# Patient Record
Sex: Female | Born: 1965 | Race: White | Hispanic: No | Marital: Married | Smoking: Current every day smoker
Health system: Southern US, Community
[De-identification: ages and names within clinical notes are randomized; demographics above are authoritative.]

## PROBLEM LIST (undated history)

## (undated) DIAGNOSIS — F419 Anxiety disorder, unspecified: Secondary | ICD-10-CM

## (undated) DIAGNOSIS — N200 Calculus of kidney: Secondary | ICD-10-CM

## (undated) DIAGNOSIS — K56609 Unspecified intestinal obstruction, unspecified as to partial versus complete obstruction: Secondary | ICD-10-CM

## (undated) DIAGNOSIS — K5090 Crohn's disease, unspecified, without complications: Secondary | ICD-10-CM

## (undated) DIAGNOSIS — Z765 Malingerer [conscious simulation]: Secondary | ICD-10-CM

## (undated) HISTORY — PX: CHOLECYSTECTOMY: SHX55

## (undated) HISTORY — PX: ABDOMINAL HYSTERECTOMY: SHX81

## (undated) HISTORY — PX: COLOSTOMY: SHX63

## (undated) HISTORY — PX: APPENDECTOMY: SHX54

## (undated) HISTORY — PX: HERNIA REPAIR: SHX51

---

## 2000-02-17 ENCOUNTER — Emergency Department (HOSPITAL_COMMUNITY): Admission: EM | Admit: 2000-02-17 | Discharge: 2000-02-17 | Payer: Self-pay | Admitting: Emergency Medicine

## 2010-07-11 ENCOUNTER — Emergency Department (HOSPITAL_BASED_OUTPATIENT_CLINIC_OR_DEPARTMENT_OTHER)
Admission: EM | Admit: 2010-07-11 | Discharge: 2010-07-12 | Payer: Self-pay | Source: Home / Self Care | Admitting: Emergency Medicine

## 2010-07-12 ENCOUNTER — Inpatient Hospital Stay (HOSPITAL_COMMUNITY): Admission: EM | Admit: 2010-07-12 | Discharge: 2010-07-14 | Payer: Self-pay | Source: Home / Self Care

## 2010-07-14 LAB — CBC
MCH: 29.7 pg (ref 26.0–34.0)
MCHC: 32.7 g/dL (ref 30.0–36.0)
Platelets: 221 10*3/uL (ref 150–400)
RDW: 13 % (ref 11.5–15.5)

## 2010-07-14 LAB — URINALYSIS, ROUTINE W REFLEX MICROSCOPIC
Bilirubin Urine: NEGATIVE
Hgb urine dipstick: NEGATIVE
Nitrite: NEGATIVE
Protein, ur: NEGATIVE mg/dL
Specific Gravity, Urine: 1.014 (ref 1.005–1.030)
Urobilinogen, UA: 0.2 mg/dL (ref 0.0–1.0)

## 2010-07-14 LAB — COMPREHENSIVE METABOLIC PANEL
ALT: 16 U/L (ref 0–35)
AST: 27 U/L (ref 0–37)
CO2: 23 mEq/L (ref 19–32)
Calcium: 9.6 mg/dL (ref 8.4–10.5)
Creatinine, Ser: 0.8 mg/dL (ref 0.4–1.2)
GFR calc Af Amer: >60 mL/min (ref >60)
GFR calc non Af Amer: >60 mL/min (ref >60)
Sodium: 145 mEq/L (ref 135–145)
Total Protein: 7.8 g/dL (ref 6.0–8.3)

## 2010-07-14 LAB — DIFFERENTIAL
Basophils Absolute: 0 10*3/uL (ref 0.0–0.1)
Basophils Relative: 0 % (ref 0–1)
Eosinophils Absolute: 0.1 10*3/uL (ref 0.0–0.7)
Eosinophils Relative: 1 % (ref 0–5)
Monocytes Absolute: 0.5 10*3/uL (ref 0.1–1.0)
Monocytes Relative: 5 % (ref 3–12)
Neutro Abs: 6.7 10*3/uL (ref 1.7–7.7)

## 2010-07-15 ENCOUNTER — Inpatient Hospital Stay (HOSPITAL_COMMUNITY): Admission: EM | Admit: 2010-07-15 | Discharge: 2010-07-16 | Payer: Self-pay | Source: Home / Self Care

## 2010-07-15 ENCOUNTER — Emergency Department (HOSPITAL_BASED_OUTPATIENT_CLINIC_OR_DEPARTMENT_OTHER)
Admission: EM | Admit: 2010-07-15 | Discharge: 2010-07-15 | Disposition: A | Discharge status: Other Acute Inpatient Hospital | Payer: Self-pay | Source: Home / Self Care | Admitting: Emergency Medicine

## 2010-07-15 LAB — DIFFERENTIAL
Basophils Absolute: 0 10*3/uL (ref 0.0–0.1)
Lymphocytes Relative: 31 % (ref 12–46)
Lymphs Abs: 1.7 10*3/uL (ref 0.7–4.0)
Neutro Abs: 3.4 10*3/uL (ref 1.7–7.7)

## 2010-07-15 LAB — CBC
HCT: 34.4 % — ABNORMAL LOW (ref 36.0–46.0)
HCT: 35.4 % — ABNORMAL LOW (ref 36.0–46.0)
Hemoglobin: 11.1 g/dL — ABNORMAL LOW (ref 12.0–15.0)
Hemoglobin: 11.8 g/dL — ABNORMAL LOW (ref 12.0–15.0)
MCH: 29.8 pg (ref 26.0–34.0)
MCHC: 32.3 g/dL (ref 30.0–36.0)
MCV: 88.3 fL (ref 78.0–100.0)
RBC: 3.72 MIL/uL — ABNORMAL LOW (ref 3.87–5.11)
RBC: 4.01 MIL/uL (ref 3.87–5.11)
WBC: 5.5 10*3/uL (ref 4.0–10.5)

## 2010-07-15 LAB — BASIC METABOLIC PANEL
CO2: 22 mEq/L (ref 19–32)
CO2: 24 mEq/L (ref 19–32)
Calcium: 9 mg/dL (ref 8.4–10.5)
Chloride: 108 mEq/L (ref 96–112)
Chloride: 106 mEq/L (ref 96–112)
Creatinine, Ser: 0.6 mg/dL (ref 0.4–1.2)
GFR calc Af Amer: >60 mL/min (ref >60)
GFR calc non Af Amer: >60 mL/min (ref >60)
Glucose, Bld: 109 mg/dL — ABNORMAL HIGH (ref 70–99)
Glucose, Bld: 99 mg/dL (ref 70–99)
Potassium: 4.1 mEq/L (ref 3.5–5.1)
Sodium: 139 mEq/L (ref 135–145)
Sodium: 143 mEq/L (ref 135–145)

## 2010-07-15 LAB — URINALYSIS, ROUTINE W REFLEX MICROSCOPIC
Nitrite: NEGATIVE
Specific Gravity, Urine: 1.005 (ref 1.005–1.030)
Urine Glucose, Fasting: NEGATIVE mg/dL
pH: 5.5 (ref 5.0–8.0)

## 2010-07-15 LAB — PREGNANCY, URINE: Preg Test, Ur: NEGATIVE

## 2010-07-16 LAB — CBC
HCT: 31.9 % — ABNORMAL LOW (ref 36.0–46.0)
Hemoglobin: 10.1 g/dL — ABNORMAL LOW (ref 12.0–15.0)
MCHC: 31.7 g/dL (ref 30.0–36.0)
MCV: 92.5 fL (ref 78.0–100.0)

## 2010-07-16 LAB — BASIC METABOLIC PANEL
BUN: 1 mg/dL — ABNORMAL LOW (ref 6–23)
CO2: 27 mEq/L (ref 19–32)
Calcium: 9.2 mg/dL (ref 8.4–10.5)
Glucose, Bld: 97 mg/dL (ref 70–99)
Sodium: 144 mEq/L (ref 135–145)

## 2010-07-17 NOTE — H&P (Addendum)
Sarah Meyers, Sarah Meyers NO.:  [id number]  MEDICAL RECORD NO.:  [id number]          PATIENT TYPE:  INP  LOCATION:  1824                         FACILITY:  [facility]  PHYSICIAN:  Abigail Miyamoto, M.D. DATE OF BIRTH:  1965/11/02  DATE OF ADMISSION:  07/12/2010 DATE OF DISCHARGE:                             HISTORY & PHYSICAL   CHIEF COMPLAINTS:  Abdominal pain, nausea, and vomiting.  HISTORY:  This is a 45 year old female transferred to [facility], for the above-mentioned complaint. The patient reports she has been having cramping abdominal pain for greater than a week.  She started developing worsening pain on this past Wednesday with associated intermittent nausea and vomiting.  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  She has been having difficulty with her ostomy appliances.  She has just moved back to the [place] area from being in [place].  She denies fevers or chills.  PAST MEDICAL HISTORY:  Crohn disease, gout, and anxiety.  PAST SURGICAL HISTORY:  Subtotal colectomy with ileostomy in 1990s in [place].  She has also had an open cholecystectomy and hysterectomy.  FAMILY HISTORY:  Noncontributory.  SOCIAL HISTORY:  She does not smoke and denies alcohol use.  REVIEW OF SYSTEMS:  GENERAL:  Negative fevers or chills.  PULMONARY: Negative for cough, shortness of breath, or difficulty breathing. CARDIAC:  Negative for chest pain or irregular heartbeat.  ENDOCRINE: Negative for diabetes.  ABDOMEN:  Listed as above.  There is no hematemesis.  There has been no blood in her ostomy.  URINARY:  Negative for dysuria or hematuria.  The rest of the review of systems including skin; eyes; ears, nose, and throat; musculoskeletal; neurologic; psychiatric; endocrine are negative except for anxiety and skin problems on her abdominal wall  secondary to her ostomy.  MEDICATIONS:  Ativan, Protonix, allopurinol.  ALLERGIES:  ASPIRIN, DEMEROL, MORPHINE, PENICILLIN.  PHYSICAL EXAMINATION:  GENERAL:  This is a morbidly obese female who is comfortable, in no acute distress. VITAL SIGNS:  Temperature 97.3, heart rate 87, respiratory rate 18, blood pressure 129/87, she is satting 95% on room air. EYES:  Anicteric.  Pupils reactive bilaterally. ENT:  External ears and nose are normal.  Hearing is normal.  Oropharynx is clear.  Oropharynx has no teeth. NECK:  Supple.  Trachea is midline.  There is no thyromegaly. LUNGS:  Clear to auscultation bilaterally.  There is normal respiratory effort. CARDIOVASCULAR:  Regular rate and rhythm.  There are no murmurs.  There is no peripheral edema. ABDOMEN:  Morbidly obese.  It is soft, nondistended with minimal tenderness.  There are multiple skin folds and chronic excoriated skin from her ileostomy.  There is another area adjacent to the ostomy, which is draining clear fluid.  There is no obvious hernia.  SKIN:  Againshows chronic skin changes around the ostomy, and there is no jaundice. The rest of her skin is normal. PSYCHIATRIC:  Awake, alert, and oriented.  Judgment and affect appear normal. NEUROLOGIC AND MUSCULOSKELETAL:  Otherwise unremarkable with normal  motor and sensory function throughout and palpable peripheral pulses in all four extremities.  LABORATORY DATA:  The patient has a white blood count of 9.2, hemoglobin 13.0, platelets 221.  BUN and creatinine are 11 and 0.8, glucose is 96, potassium is 4.2.  X-RAY DATA:  The patient has a CAT scan of the abdomen and pelvis that shows dilated loops of small bowel concentrated on the left lower quadrant of the ostomy.  There is question of enterocutaneous fistula as well as a high-grade partial bowel obstruction.  There are no inflammatory changes and no abscesses, no free air.  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  At this point, she will be admitted to the hospital.  A nasogastric tube will be placed and she will be started on bowel rest.  We will get a PICC line consult as she may need a T&A.  She will be started on IV fluids and have IV rehydration.  Hopefully this will improve without the need for surgical intervention or revision of her ostomy.  Again, the ostomy team will be consulted to help with skin care as well.     Abigail Miyamoto, M.D.     DB/MEDQ  D:  07/12/2010  T:  07/12/2010  Job:  [id number]  Electronically Signed by Abigail Miyamoto M.D. on 07/17/2010 10:03:34 AM

## 2010-07-19 NOTE — Discharge Summary (Signed)
  NAMEGAYLEN, PEREIRA             ACCOUNT NO.:  [id number]  MEDICAL RECORD NO.:  [id number]          PATIENT TYPE:  INP  LOCATION:  4506                         FACILITY:  [facility]  PHYSICIAN:  Zaleigh Bermingham A. 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  PROCEDURE PERFORMED:  None.  HISTORY OF PRESENT ILLNESS:  The patient is a 45 year old female who was admitted to our service last weekend due to what was felt to be a partial small-bowel obstruction.  This resolved and she actually went home about 2 days ago and returned to the emergency room last night because of anxiety and some discomfort around the ostomy.  She was readmitted for that.  Plain films showed no obstruction.  She had no nausea or vomiting.  HOSPITAL COURSE:  The next day, she was evaluated.  She felt much better.  She had plenty of ostomy output, had no nausea or vomiting, had normal labs and a plain film that showed no obstruction.  She wished to go home at this point that she was feeling a lot better.  She is scheduled for revision of her ileostomy next week at [facility] by Dr. Sima Matas and I told her to keep that appointment. We will go ahead and feed her regular diet this morning and send her home on her home medications.  She does have some excoriation change around the ostomy, thus revision was recommended to her for that.  She will go home on Percocet 1-2 tablets q.4 p.r.n. for pain, #30, no refills.  She will follow up with Dr. Lorin Picket since she is her better surgeon on record.  CONDITION ON DISCHARGE:  Improved.  DISCHARGE INSTRUCTIONS:   Please see above note medical reconciliation and this was reviewed and signed off on.     Delesha Pohlman A. Eris Hannan, M.D.     TAC/MEDQ  D:  07/16/2010  T:  07/16/2010  Job:  [id number]  Electronically Signed by Harriette Bouillon M.D. on 07/19/2010 12:33:58 AM

## 2010-07-29 NOTE — H&P (Signed)
NAMECYDNI, REDDOCH NO.:  [id number]  MEDICAL RECORD NO.:  [id number]          PATIENT TYPE:  INP  LOCATION:  4506                         FACILITY:  [facility]  PHYSICIAN:  Almond Lint, MD       DATE OF BIRTH:  1966/02/06  DATE OF ADMISSION:  07/15/2010 DATE OF DISCHARGE:                             HISTORY & PHYSICAL   REQUESTING PHYSICIAN:  Dr. Bebe Shaggy from [facility].  CHIEF COMPLAINT:  Abdominal pain.  HISTORY OF PRESENT ILLNESS:  Ms. Reindel is a 45 year old female who was admitted to our service on January 22 and discharged on January 24.  She on January 22nd was having nausea, vomiting, and abdominal pain and had a CT scan consistent with a high-grade small bowel obstruction.  She had a NG tube which was clamped and it sounds like yesterday they were trying to give her some sips but she became adamant that she leave the hospital and did so despite recommendations to stay.  She continued to have nausea, vomiting, and abdominal pain at home.  She has been having diarrhea for around 4 months and over the last 2 months has started having redness around her ostomy. She sees Dr. Lorin Picket with surgery at [facility].   Dr. Lorin Picket has supposedly scheduled on February 2 for revision of her ileostomy.  On her CT scan that was done here several days ago, there is also a question of an enterocutaneous fistula which certainly may contribute to the erythema and pain around her ostomy.  She denies fevers and chills.  She has been taking her diarrhea meds, but she is not sure if it is loperamide or Lomotil.  She has not had any relief of her diarrhea for several months.  Of note, she has emptied a significant amount of gas out of her ostomy today.  She also has had continued liquid stool.  PAST MEDICAL HISTORY:  Significant for Crohn's, gout, and anxiety.  PAST SURGICAL HISTORY:  Total colectomy with ileostomy in 1990s at [facility], open  cholecystectomy, and hysterectomy.  FAMILY HISTORY:  Noncontributory.  SOCIAL HISTORY:  No substance abuse is noted.  DRUG ALLERGIES: 1. PENICILLIN. 2. MORPHINE. 3. ASPIRIN. 4. DEMEROL. 5. ERYTHROMYCIN BASE.  MEDICATIONS:  Allopurinol, Ambien, Tylenol No. 3, Klonopin, and either loperamide or Lomotil.  REVIEW OF SYSTEMS:  Otherwise negative x11.  PHYSICAL EXAMINATION:  VITAL SIGNS:  Temperature 98.2, pulse 71, respiratory rate 18, and blood pressure 134/68. GENERAL:  She looks slightly uncomfortable but otherwise is in no acute distress. HEENT:  Normocephalic and atraumatic.  Sclerae are anicteric. NECK:  Supple.  No lymphadenopathy.  No thyromegaly.  Trachea is midline. HEART:  Regular rate and rhythm.  No murmurs. LUNGS:  Clear to auscultation bilaterally without wheezes, rales, or rhonchi. ABDOMEN:  Soft.  Mild diffuse tenderness.  Ostomy has some chronic skin changes and erythema around it and there is significant amount of gas and pure watery stool in the bag. EXTREMITIES:  Warm and well perfused.NEUROLOGIC:  No gross motor or sensory deficits.  LABORATORY DATA:  Sodium 143, potassium 4.1, chloride 106, CO2 of 24, BUN 2,  creatinine 0.7, glucose 99, and calcium 10.1.  White count 5.5, hemoglobin and hematocrit 11.8 and 35.4, platelet count 331,000.  UA is negative.  Urine pregnancy test is negative, although she has had a hysterectomy supposedly.  CT scan performed on January 22 is described above.  There is no new film.  ASSESSMENT AND PLAN:  Mrs. 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  She does not seem to be clinically obstructed right now given the amount of gas and stool coming out of her bag.  We will keep her n.p.o. and provide  pain control and try to contact Dr. Lorin Picket tomorrow to see what his recommendations are. It sounds that they are having significant  issues with beds over at [facility] and it may be necessary to get her on the list for bed.     Almond Lint, MD     FB/MEDQ  D:  07/15/2010  T:  07/16/2010  Job:  [id number]  Electronically Signed by Almond Lint MD on 07/29/2010 12:15:33 PM

## 2010-08-05 NOTE — Discharge Summary (Signed)
NAMEANAVEY, COOMBES NO.:  [id number]  MEDICAL RECORD NO.:  [id number]          PATIENT TYPE:  INP  LOCATION:  5124                         FACILITY:  [facility]  PHYSICIAN:  Abigail Miyamoto, M.D. DATE OF BIRTH:  1965-06-26  DATE OF ADMISSION:  07/12/2010 DATE OF DISCHARGE:  07/14/2010                        DISCHARGE SUMMARY - REFERRING   ADMISSION DIAGNOSES: 1. High-grade partial small bowel obstruction. 2. 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 3. History of Crohn disease. 4. History of gout. 5. Anxiety. 6. Tobacco use.  DISCHARGE DIAGNOSES: 1. High-grade partial small bowel obstruction. 2. 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 3. History of Crohn disease. 4. History of gout. 5. Anxiety. 6. Tobacco use.  PROCEDURES: 1. CT of the abdomen and pelvis with contrast showing a high grade     distal small bowel obstruction with a transition point in the left     lower quadrant deep to the ileostomy most likely due to adhesions. 2. Left lower quadrant enterocutaneous fistula, lateral aspect of the     ileostomy.  BRIEF HISTORY:  The patient is a 45 year old white female with a history of Crohn disease who is status post subtotal colectomy with ileostomy in the 1990s in [place].  She has also had an open cholecystectomy and hysterectomy.  She presented on the day of admission with complaints of abdominal pain for more than a week.  She developed worsening pain over the past 24 hours with intermittent nausea and vomiting.  She reports ileostomy had been working well, but has recently had a slow decrease in output. She was having difficulty with her ostomy appliances and reports she just moved back to the [place] area from [place].  Additional history was later obtained, showed that she had been placed on doxycycline prior to this admission for an infection around her ostomy and that she was also scheduled to see Dr.  Lorin Picket for a revision of her ileostomy from the left side to the right side secondary to this nonhealing, infected, cutaneous problem which has been ongoing for some time.  The patient was seen in the ER by Dr. Magnus Ivan with the above CT findings and admitted for bowel rest, NG drainage, and further workup and evaluation as needed.  He initially planned on PICC line for possible TNA therapy.  He was completely unaware of her history of infection in the skin or plans for ostomy revision.  PAST MEDICAL HISTORY:  Crohn disease, gout, and anxiety.  SURGICAL HISTORY:  Subtotal colectomy with ileostomy in the 90s.  She also is status post open cholecystectomy and hysterectomy.  MEDICATIONS:  Listed included Ativan, Protonix, and allopurinol.  ALLERGIES:  Aspirin, Demerol, morphine, and penicillin.  For further history and physical please see the dictated note.  HOSPITAL COURSE:  The patient was admitted, placed on NG suction.  She had a fair amount of drainage out of her NG over 400 mL the following morning with good fluid intake.  Her sats were noted to be dropping down into the 80s when she moved around.  We asked her about smoking and she at that time admitted up to 2 packs a day  since age 55 and she is down to half a pack a day.  She had previously reported no tobacco use. Medications at that time included Pepcid 20 mg IV b.i.d., Zofran p.r.n., and Dilaudid PCA.  The patient reported that the skin infection that she is being treated for was also labelled as MRSA.  We have no documentation of this.  She had a fair amount of green drainage not only from her NG but from her ostomy as she was left on suction overnight and continued on IV fluids.  The ostomy nurse saw the patient and helped provide appropriate dressings and ostomy bags.  The following morning she was afebrile.  Her heart rate was in the 70s.  Blood pressure was between 106/69 and 142/80.  Her NG had put out 800 mL.   Ileostomy had put out 470 mL.  In addition it was noted that she was taking large doses of Dilaudid.  Her white count was 3.9, hemoglobin 11, hematocrit 34, platelet count was 169,000.  Sodium was 139, potassium was 4.3, chloride was 108, CO2 is 22, BUN is less than 1, creatinine is 0.6, mag was 2.0.  She complained at the site of erythema for which she took Tylenol No. 3 at home.  We reduced her Dilaudid PCA dose.  We clamped her NG and planned to resume suction after 4 hours to see much she drained from her ileostomy.  After reduction of her medicine and clamping of the NG, the patient decided to remove her NG on her own.  She was apparently found in the bathroom smoking a cigarette and this was discontinued.  She was stable with her NG clamp.  We planned to start her on sips and advance her back to solid foods, but at that point she decided she wanted to go home.  We recommended she stay, maintain sips tonight, advance her diet slowly. She was adamant about going home.  It was discussed with Dr. Luisa Hart and he was in agreeed to allow the the patient go home.  We recommend that she maintain a clear liquid diet for the next 24-48 hours and not advance past a full diet.  She will resume her preadmission medications which includes clonazepam 1 tablet 3 times a day, lorazepam 1 tablet t.i.d., Protonix 40 mg b.i.d., and Tylenol with Codeine 1 q.12 p.r.n.Marland Kitchen  She is recommended light activity, nothing strenuous.  If she has any recurrent nausea or discomfort, we recommend she is see Dr. Lorin Picket and follow up at [facility] so she can go forward with her surgery next week.  CONDITION ON DISCHARGE:  Stable.     Eber Hong, P.A.   ______________________________ Abigail Miyamoto, M.D.    WDJ/MEDQ  D:  07/14/2010  T:  07/14/2010  Job:  [id number]  cc:   Dr. Gwenyth Bouillon Dr. Madilyn Fireman  Electronically Signed by Sherrie George P.A. on 07/29/2010 01:34:56  PM Electronically Signed by Abigail Miyamoto M.D. on 08/05/2010 07:28:53 PM

## 2011-01-16 ENCOUNTER — Emergency Department (HOSPITAL_BASED_OUTPATIENT_CLINIC_OR_DEPARTMENT_OTHER)
Admission: EM | Admit: 2011-01-16 | Discharge: 2011-01-17 | Disposition: A | Discharge status: Home / Self Care | Payer: BC Managed Care – PPO | Attending: Emergency Medicine | Admitting: Emergency Medicine

## 2011-01-16 ENCOUNTER — Encounter: Payer: Self-pay | Admitting: Emergency Medicine

## 2011-01-16 DIAGNOSIS — K5090 Crohn's disease, unspecified, without complications: Secondary | ICD-10-CM | POA: Insufficient documentation

## 2011-01-16 DIAGNOSIS — R109 Unspecified abdominal pain: Secondary | ICD-10-CM

## 2011-01-16 HISTORY — DX: Crohn's disease, unspecified, without complications: K50.90

## 2011-01-16 HISTORY — DX: Calculus of kidney: N20.0

## 2011-01-16 LAB — URINALYSIS, ROUTINE W REFLEX MICROSCOPIC
Bilirubin Urine: NEGATIVE
Glucose, UA: NEGATIVE mg/dL
Hgb urine dipstick: NEGATIVE
Ketones, ur: NEGATIVE mg/dL
Leukocytes, UA: NEGATIVE
Nitrite: NEGATIVE
Protein, ur: NEGATIVE mg/dL
Specific Gravity, Urine: 1.008 (ref 1.005–1.030)
Urobilinogen, UA: 0.2 mg/dL (ref 0.0–1.0)
pH: 6 (ref 5.0–8.0)

## 2011-01-16 NOTE — ED Notes (Signed)
Pt with hx of kidney stone 4 years ago pt also with nausea

## 2011-01-16 NOTE — ED Notes (Signed)
Pt c/o RT flank pain since 12pm today. Hx of kidney stones.

## 2011-01-17 ENCOUNTER — Emergency Department (INDEPENDENT_AMBULATORY_CARE_PROVIDER_SITE_OTHER): Payer: BC Managed Care – PPO

## 2011-01-17 ENCOUNTER — Encounter (HOSPITAL_BASED_OUTPATIENT_CLINIC_OR_DEPARTMENT_OTHER): Payer: Self-pay | Admitting: *Deleted

## 2011-01-17 DIAGNOSIS — Z9089 Acquired absence of other organs: Secondary | ICD-10-CM

## 2011-01-17 DIAGNOSIS — Z90710 Acquired absence of both cervix and uterus: Secondary | ICD-10-CM

## 2011-01-17 DIAGNOSIS — R1031 Right lower quadrant pain: Secondary | ICD-10-CM

## 2011-01-17 DIAGNOSIS — Z87442 Personal history of urinary calculi: Secondary | ICD-10-CM

## 2011-01-17 MED ORDER — OXYCODONE-ACETAMINOPHEN 5-325 MG PO TABS: 1.0000 | ORAL_TABLET | ORAL | Status: AC | PRN

## 2011-01-17 MED ORDER — HYDROMORPHONE HCL 1 MG/ML IJ SOLN
1.0000 mg | Freq: Once | INTRAMUSCULAR | Status: AC
  Administered 2011-01-17: 1 mg via INTRAVENOUS
  Filled 2011-01-17: qty 1

## 2011-01-17 MED ORDER — ONDANSETRON HCL 4 MG/2ML IJ SOLN
4.0000 mg | Freq: Once | INTRAMUSCULAR | Status: AC
  Administered 2011-01-17: 4 mg via INTRAVENOUS
  Filled 2011-01-17: qty 2

## 2011-01-17 MED ORDER — SODIUM CHLORIDE 0.9 % IV SOLN
Freq: Once | INTRAVENOUS | Status: AC
  Administered 2011-01-17: 1000 mL via INTRAVENOUS

## 2011-01-17 NOTE — ED Provider Notes (Signed)
History     Chief Complaint  Patient presents with  . Flank Pain   Patient is a 45 y.o. female presenting with flank pain. The history is provided by the patient.  Flank Pain This is a new problem. The current episode started 12 to 24 hours ago (Pain started at 1200.). The problem occurs constantly. The problem has been gradually worsening. Pertinent negatives include no abdominal pain. Associated symptoms comments: She has had nausea, but no vomiting. No fever or chills. She is having difficulty urinating.. The symptoms are aggravated by nothing. The symptoms are relieved by nothing (Pain is severe - she rates it at 10/10. Pain is similar to previous kidney stones.). Treatments tried: She has tried heat, and taken two doses of Aleve wtih no relief.    Past Medical History  Diagnosis Date  . Kidney stones   . Crohn disease   . Gout     Past Surgical History  Procedure Date  . Cesarean section   . Colostomy   . Hernia repair   . Cholecystectomy   . Appendectomy   . Abdominal hysterectomy     No family history on file.  History  Substance Use Topics  . Smoking status: Never Smoker   . Smokeless tobacco: Not on file  . Alcohol Use: No    OB History    Grav Para Term Preterm Abortions TAB SAB Ect Mult Living                  Review of Systems  Gastrointestinal: Negative for abdominal pain.  Genitourinary: Positive for flank pain.  All other systems reviewed and are negative.    Physical Exam  BP 138/90  Pulse 109  Temp(Src) 97.7 F (36.5 C) (Oral)  Resp 23  Ht 5\' 3"  (1.6 m)  Wt 215 lb (97.523 kg)  BMI 38.09 kg/m2  SpO2 100%  Physical Exam  Nursing note and vitals reviewed. Constitutional: She is oriented to person, place, and time. She appears well-developed and well-nourished.       Obese. Appears to be in pain.  HENT:  Head: Normocephalic and atraumatic.  Right Ear: External ear normal.  Left Ear: External ear normal.  Mouth/Throat: Oropharynx is  clear and moist.  Eyes: Conjunctivae and EOM are normal. Pupils are equal, round, and reactive to light. No scleral icterus.  Neck: Normal range of motion. Neck supple. No JVD present.  Cardiovascular: Normal rate, regular rhythm and normal heart sounds.   No murmur heard. Pulmonary/Chest: Effort normal and breath sounds normal. She has no wheezes. She has no rales.  Abdominal: Soft. She exhibits no mass. There is no tenderness.       Colostomy present RLQ. Peristalsis is diminished.  Genitourinary:       No CVA tenderness.  Lymphadenopathy:    She has no cervical adenopathy.  Neurological: She is alert and oriented to person, place, and time. No cranial nerve deficit. Coordination normal.  Skin: Skin is warm and dry. No rash noted.  Psychiatric: She has a normal mood and affect.    ED Course  Procedures  MDM Pain is improved after a dose of dilaudid. No clear cause for pain - will treat symptomatically. Labs and x-rays reviewed, images viewed by me.  Results for orders placed during the hospital encounter of 01/16/11  URINALYSIS, ROUTINE W REFLEX MICROSCOPIC      Component Value Range   Color, Urine YELLOW  YELLOW    Appearance CLEAR  CLEAR  Specific Gravity, Urine 1.008  1.005 - 1.030    pH 6.0  5.0 - 8.0    Glucose, UA NEGATIVE  NEGATIVE (mg/dL)   Hgb urine dipstick NEGATIVE  NEGATIVE    Bilirubin Urine NEGATIVE  NEGATIVE    Ketones, ur NEGATIVE  NEGATIVE (mg/dL)   Protein, ur NEGATIVE  NEGATIVE (mg/dL)   Urobilinogen, UA 0.2  0.0 - 1.0 (mg/dL)   Nitrite NEGATIVE  NEGATIVE    Leukocytes, UA NEGATIVE  NEGATIVE    Ct Abdomen Pelvis Wo Contrast  01/17/2011  *RADIOLOGY REPORT*  Clinical Data: Right flank pain.  Right lower abdominal pain. History of kidney stones.  Previous cholecystectomy, appendectomy, hysterectomy.  CT ABDOMEN AND PELVIS WITHOUT CONTRAST  Technique:  Multidetector CT imaging of the abdomen and pelvis was performed following the standard protocol without  intravenous contrast.  Comparison: 07/12/2010  Findings: No focal abnormalities seen in the liver or spleen on this study performed without intravenous contrast material.  The stomach, duodenum, pancreas, and adrenal glands are unremarkable. The gallbladder is surgically absent.  Kidneys are unremarkable.  No abdominal aortic aneurysm.  There is no free fluid or lymphadenopathy in the abdomen.  The patient is status post colectomy.  A right abdominal ileostomy is new in the interval.  The patient had a left-sided the ileostomy on the previous study which has apparently been taken down in the interval.  There is a persistent fascial defect at the site of the previous ostomy in the left abdominal wall consistent with hernia.  No evidence for bowel obstruction.  There is no free fluid in the pelvis.  Bladder is decompressed.  Tiny gas locule is seen in the nondependent anterior bladder which may be related to infection or recent instrumentation.  The uterus is surgically absent.  There is no adnexal mass.  No pelvic sidewall lymphadenopathy.  Bone windows reveal no worrisome lytic or sclerotic osseous lesions.  IMPRESSION: No CT findings to explain the patient's history of right lower quadrant pain.  Specifically, no evidence for urinary stones or bowel obstruction.  Tiny gas locules seen in the bladder lumen.  This could be from infection or recent instrumentation.  Original Report Authenticated By: ERIC A. MANSELL, M.D.          Dione Booze, MD 01/17/11 [phone]

## 2011-02-23 ENCOUNTER — Encounter (HOSPITAL_BASED_OUTPATIENT_CLINIC_OR_DEPARTMENT_OTHER): Payer: Self-pay | Admitting: *Deleted

## 2011-02-23 DIAGNOSIS — R109 Unspecified abdominal pain: Secondary | ICD-10-CM | POA: Insufficient documentation

## 2011-02-23 DIAGNOSIS — K5090 Crohn's disease, unspecified, without complications: Secondary | ICD-10-CM | POA: Insufficient documentation

## 2011-02-23 DIAGNOSIS — M549 Dorsalgia, unspecified: Secondary | ICD-10-CM | POA: Insufficient documentation

## 2011-02-23 DIAGNOSIS — F17200 Nicotine dependence, unspecified, uncomplicated: Secondary | ICD-10-CM | POA: Insufficient documentation

## 2011-02-23 NOTE — ED Notes (Signed)
C/o right flank pain that began approx 7hours ago while at home. C/o N/V. Urinary urgency and frequency. Denies hematuria or dysuria. Hx kidney stones.

## 2011-02-24 ENCOUNTER — Emergency Department (HOSPITAL_BASED_OUTPATIENT_CLINIC_OR_DEPARTMENT_OTHER)
Admission: EM | Admit: 2011-02-24 | Discharge: 2011-02-24 | Disposition: A | Discharge status: Home / Self Care | Payer: BC Managed Care – PPO | Attending: Emergency Medicine | Admitting: Emergency Medicine

## 2011-02-24 DIAGNOSIS — M549 Dorsalgia, unspecified: Secondary | ICD-10-CM

## 2011-02-24 HISTORY — DX: Anxiety disorder, unspecified: F41.9

## 2011-02-24 LAB — URINALYSIS, ROUTINE W REFLEX MICROSCOPIC
Bilirubin Urine: NEGATIVE
Hgb urine dipstick: NEGATIVE
Nitrite: NEGATIVE
Specific Gravity, Urine: 1.007 (ref 1.005–1.030)
pH: 5.5 (ref 5.0–8.0)

## 2011-02-24 MED ORDER — ONDANSETRON 8 MG PO TBDP
8.0000 mg | ORAL_TABLET | Freq: Once | ORAL | Status: AC
  Administered 2011-02-24: 8 mg via ORAL
  Filled 2011-02-24: qty 1

## 2011-02-24 MED ORDER — HYDROMORPHONE HCL 2 MG/ML IJ SOLN
2.0000 mg | Freq: Once | INTRAMUSCULAR | Status: AC
  Administered 2011-02-24: 2 mg via INTRAMUSCULAR
  Filled 2011-02-24: qty 1

## 2011-02-24 MED ORDER — KETOROLAC TROMETHAMINE 30 MG/ML IJ SOLN
60.0000 mg | Freq: Once | INTRAMUSCULAR | Status: AC
  Administered 2011-02-24: 60 mg via INTRAMUSCULAR
  Filled 2011-02-24: qty 2

## 2011-02-24 MED ORDER — CYCLOBENZAPRINE HCL 10 MG PO TABS: 10.0000 mg | ORAL_TABLET | Freq: Two times a day (BID) | ORAL | Status: AC | PRN

## 2011-02-24 MED ORDER — IBUPROFEN 800 MG PO TABS: 800.0000 mg | ORAL_TABLET | Freq: Three times a day (TID) | ORAL | Status: AC

## 2011-02-24 NOTE — ED Notes (Signed)
Dr Bernette Mayers at bedside to reassess pt

## 2011-02-24 NOTE — ED Provider Notes (Signed)
History     CSN: [id number] Arrival date & time: 02/24/2011 12:22 AM  Chief Complaint  Patient presents with  . Flank Pain   HPI Pt reports sudden onset of R flank/lower back pain earlier today. Non radiating, no associated dyrusia or hematuria. Some urinary frequency. States feels like prior kidney stones. Pain is severe aching and worse with movement/bending/twisting.  No fever, vomiting, or diarrhea.   Past Medical History  Diagnosis Date  . Kidney stones   . Crohn disease   . Gout   . Kidney stones   . Anxiety     Past Surgical History  Procedure Date  . Cesarean section   . Colostomy   . Hernia repair   . Cholecystectomy   . Appendectomy   . Abdominal hysterectomy     No family history on file.  History  Substance Use Topics  . Smoking status: Current Everyday Smoker  . Smokeless tobacco: Not on file  . Alcohol Use: No    OB History    Grav Para Term Preterm Abortions TAB SAB Ect Mult Living                  Review of Systems All other systems reviewed and are negative except as noted in HPI.   Physical Exam  BP 111/71  Pulse 73  Temp(Src) 98.3 F (36.8 C) (Oral)  Resp 18  SpO2 97%  Physical Exam  Nursing note and vitals reviewed. Constitutional: She is oriented to person, place, and time. She appears well-developed and well-nourished.  HENT:  Head: Normocephalic and atraumatic.  Eyes: EOM are normal. Pupils are equal, round, and reactive to light.  Neck: Normal range of motion. Neck supple.  Cardiovascular: Normal rate, normal heart sounds and intact distal pulses.   Pulmonary/Chest: Effort normal and breath sounds normal.  Abdominal: Bowel sounds are normal. She exhibits no distension. There is no tenderness.  Genitourinary:       No CVA tenderness  Musculoskeletal: Normal range of motion. She exhibits no edema and no tenderness.       Spine non-tender. Pt tender over the R paraspinal lumbar area  Neurological: She is alert and oriented to  person, place, and time. No cranial nerve deficit.  Skin: Skin is warm and dry. No rash noted.  Psychiatric: She has a normal mood and affect.    ED Course  Procedures  MDM Pt seen in the ED for similar symptoms about a month ago. Had neg CT then, no stones in kidneys or ureters. Her pain is improved with IM meds. Review of the narcotic database shows she has been getting monthly oxycodone from her own doctor. Given Rx for ibuprofen and flexeril      Charles B. Bernette Mayers, MD 02/24/11 [id number]

## 2011-02-24 NOTE — ED Notes (Signed)
Pt given ice chips

## 2011-09-15 ENCOUNTER — Emergency Department (HOSPITAL_BASED_OUTPATIENT_CLINIC_OR_DEPARTMENT_OTHER)
Admission: EM | Admit: 2011-09-15 | Discharge: 2011-09-16 | Disposition: A | Discharge status: Home / Self Care | Payer: BC Managed Care – PPO | Attending: Emergency Medicine | Admitting: Emergency Medicine

## 2011-09-15 ENCOUNTER — Encounter (HOSPITAL_BASED_OUTPATIENT_CLINIC_OR_DEPARTMENT_OTHER): Payer: Self-pay | Admitting: Emergency Medicine

## 2011-09-15 DIAGNOSIS — Z79899 Other long term (current) drug therapy: Secondary | ICD-10-CM | POA: Insufficient documentation

## 2011-09-15 DIAGNOSIS — IMO0001 Reserved for inherently not codable concepts without codable children: Secondary | ICD-10-CM | POA: Insufficient documentation

## 2011-09-15 DIAGNOSIS — R197 Diarrhea, unspecified: Secondary | ICD-10-CM | POA: Insufficient documentation

## 2011-09-15 DIAGNOSIS — M549 Dorsalgia, unspecified: Secondary | ICD-10-CM | POA: Insufficient documentation

## 2011-09-15 DIAGNOSIS — R10819 Abdominal tenderness, unspecified site: Secondary | ICD-10-CM | POA: Insufficient documentation

## 2011-09-15 DIAGNOSIS — R0682 Tachypnea, not elsewhere classified: Secondary | ICD-10-CM | POA: Insufficient documentation

## 2011-09-15 DIAGNOSIS — R5381 Other malaise: Secondary | ICD-10-CM | POA: Insufficient documentation

## 2011-09-15 DIAGNOSIS — R109 Unspecified abdominal pain: Secondary | ICD-10-CM | POA: Insufficient documentation

## 2011-09-15 DIAGNOSIS — R112 Nausea with vomiting, unspecified: Secondary | ICD-10-CM

## 2011-09-15 DIAGNOSIS — R6883 Chills (without fever): Secondary | ICD-10-CM | POA: Insufficient documentation

## 2011-09-15 HISTORY — DX: Unspecified intestinal obstruction, unspecified as to partial versus complete obstruction: K56.609

## 2011-09-15 LAB — URINALYSIS, ROUTINE W REFLEX MICROSCOPIC
Bilirubin Urine: NEGATIVE
Glucose, UA: NEGATIVE mg/dL
Hgb urine dipstick: NEGATIVE
Ketones, ur: NEGATIVE mg/dL
Leukocytes, UA: NEGATIVE
Nitrite: NEGATIVE
Protein, ur: NEGATIVE mg/dL
Specific Gravity, Urine: 1.01 (ref 1.005–1.030)
Urobilinogen, UA: 0.2 mg/dL (ref 0.0–1.0)
pH: 5.5 (ref 5.0–8.0)

## 2011-09-15 NOTE — ED Notes (Signed)
Pt c/o abd pain with n/v/d today

## 2011-09-16 ENCOUNTER — Emergency Department (INDEPENDENT_AMBULATORY_CARE_PROVIDER_SITE_OTHER): Payer: BC Managed Care – PPO

## 2011-09-16 ENCOUNTER — Encounter (HOSPITAL_BASED_OUTPATIENT_CLINIC_OR_DEPARTMENT_OTHER): Payer: Self-pay | Admitting: Emergency Medicine

## 2011-09-16 DIAGNOSIS — R197 Diarrhea, unspecified: Secondary | ICD-10-CM

## 2011-09-16 DIAGNOSIS — I517 Cardiomegaly: Secondary | ICD-10-CM

## 2011-09-16 DIAGNOSIS — R109 Unspecified abdominal pain: Secondary | ICD-10-CM

## 2011-09-16 DIAGNOSIS — R112 Nausea with vomiting, unspecified: Secondary | ICD-10-CM

## 2011-09-16 DIAGNOSIS — R1110 Vomiting, unspecified: Secondary | ICD-10-CM

## 2011-09-16 LAB — COMPREHENSIVE METABOLIC PANEL
ALT: 20 U/L (ref 0–35)
AST: 26 U/L (ref 0–37)
Alkaline Phosphatase: 119 U/L — ABNORMAL HIGH (ref 39–117)
CO2: 23 mEq/L (ref 19–32)
Chloride: 103 mEq/L (ref 96–112)
GFR calc Af Amer: >90 mL/min (ref >90)
GFR calc non Af Amer: >90 mL/min (ref >90)
Glucose, Bld: 101 mg/dL — ABNORMAL HIGH (ref 70–99)
Potassium: 4 mEq/L (ref 3.5–5.1)
Sodium: 136 mEq/L (ref 135–145)
Total Bilirubin: 0.3 mg/dL (ref 0.3–1.2)

## 2011-09-16 LAB — COMPREHENSIVE METABOLIC PANEL WITH GFR
Albumin: 3.4 g/dL — ABNORMAL LOW (ref 3.5–5.2)
BUN: 22 mg/dL (ref 6–23)
Calcium: 9.9 mg/dL (ref 8.4–10.5)
Creatinine, Ser: 0.7 mg/dL (ref 0.50–1.10)
Total Protein: 7 g/dL (ref 6.0–8.3)

## 2011-09-16 LAB — DIFFERENTIAL
Basophils Absolute: 0 10*3/uL (ref 0.0–0.1)
Basophils Relative: 1 % (ref 0–1)
Eosinophils Absolute: 0.2 K/uL (ref 0.0–0.7)
Eosinophils Relative: 3 % (ref 0–5)
Lymphocytes Relative: 37 % (ref 12–46)
Lymphs Abs: 2.5 10*3/uL (ref 0.7–4.0)
Monocytes Absolute: 0.4 K/uL (ref 0.1–1.0)
Monocytes Relative: 6 % (ref 3–12)
Neutro Abs: 3.5 10*3/uL (ref 1.7–7.7)
Neutrophils Relative %: 53 % (ref 43–77)

## 2011-09-16 LAB — CBC
HCT: 36.3 % (ref 36.0–46.0)
Hemoglobin: 11.8 g/dL — ABNORMAL LOW (ref 12.0–15.0)
MCH: 31 pg (ref 26.0–34.0)
MCHC: 32.5 g/dL (ref 30.0–36.0)
MCV: 95.3 fL (ref 78.0–100.0)
Platelets: 190 10*3/uL (ref 150–400)
RBC: 3.81 MIL/uL — ABNORMAL LOW (ref 3.87–5.11)
RDW: 14.4 % (ref 11.5–15.5)
WBC: 6.6 10*3/uL (ref 4.0–10.5)

## 2011-09-16 LAB — LIPASE, BLOOD: Lipase: 37 U/L (ref 11–59)

## 2011-09-16 LAB — OCCULT BLOOD X 1 CARD TO LAB, STOOL: Fecal Occult Bld: POSITIVE

## 2011-09-16 MED ORDER — HYDROMORPHONE HCL PF 1 MG/ML IJ SOLN
1.0000 mg | Freq: Once | INTRAMUSCULAR | Status: AC
  Administered 2011-09-16: 1 mg via INTRAVENOUS
  Filled 2011-09-16: qty 1

## 2011-09-16 MED ORDER — ONDANSETRON HCL 4 MG/2ML IJ SOLN
4.0000 mg | Freq: Once | INTRAMUSCULAR | Status: AC
  Administered 2011-09-16: 4 mg via INTRAVENOUS
  Filled 2011-09-16: qty 2

## 2011-09-16 MED ORDER — SODIUM CHLORIDE 0.9 % IV BOLUS (SEPSIS)
500.0000 mL | Freq: Once | INTRAVENOUS | Status: AC
  Administered 2011-09-16: 02:00:00 via INTRAVENOUS

## 2011-09-16 MED ORDER — ONDANSETRON 8 MG PO TBDP: 8.0000 mg | ORAL_TABLET | Freq: Two times a day (BID) | ORAL | Status: AC | PRN

## 2011-09-16 MED ORDER — SODIUM CHLORIDE 0.9 % IV SOLN
Freq: Once | INTRAVENOUS | Status: AC
  Administered 2011-09-16: 02:00:00 via INTRAVENOUS

## 2011-09-16 MED ORDER — PANTOPRAZOLE SODIUM 40 MG IV SOLR
40.0000 mg | Freq: Once | INTRAVENOUS | Status: AC
  Administered 2011-09-16: 40 mg via INTRAVENOUS
  Filled 2011-09-16: qty 40

## 2011-09-16 MED ORDER — ONDANSETRON HCL 4 MG/2ML IJ SOLN: 4.0000 mg | Freq: Once | INTRAMUSCULAR | Status: DC

## 2011-09-16 NOTE — ED Provider Notes (Signed)
History     CSN: [id number]  Arrival date & time 09/15/11  2223   First MD Initiated Contact with Patient 09/15/11 2353      Chief Complaint  Patient presents with  . Emesis  . Diarrhea  . Abdominal Pain    (Consider location/radiation/quality/duration/timing/severity/associated sxs/prior treatment) HPI Comments: Patient with significant history of Crohn's disease as well as small bowel obstruction according to a hospital admission records from 2011. 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 Patient reports that she developed some upper and mid abdominal pain that was nonradiating around her ostomy site followed by several episodes of vomiting. She attempted to take her usual medications and eat some chicken noodle soup without success. This resulted in further episodes of vomiting. About 5 hours after this, the patient now has had several episodes of watery diarrhea at of her ostomy. She reports while in the waiting room for about an hour and a half, she had to empty her back about 4 times. She reports that she feels fatigued. She does have some chronic pain due to osteoarthritis and has not been able to take any of her usual medications including allopurinol and her Percocet. She reports some chills, is unsure about actual fevers. She is unaware of any obvious sick contacts.  The history is provided by the patient and medical records.    Past Medical History  Diagnosis Date  . Kidney stones   . Crohn disease   . Gout   . Kidney stones   . Anxiety   . Bowel obstruction     Past Surgical History  Procedure Date  . Cesarean section   . Colostomy   . Hernia repair   . Cholecystectomy   . Appendectomy   . Abdominal hysterectomy     No family history on file.  History  Substance Use Topics  . Smoking status: Current Everyday Smoker  . Smokeless tobacco: Not on file  . Alcohol Use: No    OB History    Grav Para Term Preterm Abortions  TAB SAB Ect Mult Living                  Review of Systems  Constitutional: Positive for chills, appetite change and fatigue. Negative for fever.  HENT: Negative for congestion, rhinorrhea and neck pain.   Respiratory: Negative for cough and chest tightness.   Cardiovascular: Negative for chest pain.  Gastrointestinal: Positive for nausea, vomiting, abdominal pain and diarrhea. Negative for abdominal distention.  Genitourinary: Negative for flank pain.  Musculoskeletal: Positive for myalgias and back pain.  Skin: Negative for color change and rash.  Neurological: Positive for weakness. Negative for dizziness, light-headedness and headaches.  All other systems reviewed and are negative.    Allergies  Erythromycin; Morphine and related; Flagyl; Aspirin; and Penicillins  Home Medications   Current Outpatient Rx  Name Route Sig Dispense Refill  . ALBUTEROL 90 MCG/ACT IN AERS Inhalation Inhale 1 puff into the lungs 2 (two) times daily as needed. For shortness of breath     . ALBUTEROL IN Inhalation Inhale into the lungs.      . ALLOPURINOL 100 MG PO TABS Oral Take 100 mg by mouth daily.      . ALLOPURINOL PO Oral Take by mouth.      . OXYCODONE-ACETAMINOPHEN 10-325 MG PO TABS Oral Take 1 tablet by mouth every 6 (six) hours as needed.      Marland Kitchen ONDANSETRON 8  MG PO TBDP Oral Take 1 tablet (8 mg total) by mouth every 12 (twelve) hours as needed for nausea. 20 tablet 0    BP 113/72  Pulse 85  Temp(Src) 97.6 F (36.4 C) (Oral)  Resp 20  Ht 5\' 2"  (1.575 m)  Wt 211 lb (95.709 kg)  BMI 38.59 kg/m2  SpO2 100%  Physical Exam  Nursing note and vitals reviewed. Constitutional: She is oriented to person, place, and time. She appears well-developed and well-nourished.  HENT:  Head: Normocephalic and atraumatic.  Eyes: Pupils are equal, round, and reactive to light. No scleral icterus.  Neck: Neck supple.  Cardiovascular: Normal rate.   Pulmonary/Chest: Breath sounds normal. Tachypnea  noted. No respiratory distress. She has no wheezes. She has no rhonchi.  Abdominal: Soft. Normal appearance. There is tenderness in the epigastric area and periumbilical area. There is no rebound, no guarding and no CVA tenderness.    Musculoskeletal: She exhibits no edema and no tenderness.  Neurological: She is alert and oriented to person, place, and time.  Skin: Skin is warm and dry.    ED Course  Procedures (including critical care time)  Labs Reviewed  CBC - Abnormal; Notable for the following:    RBC 3.81 (*)    Hemoglobin 11.8 (*)    All other components within normal limits  COMPREHENSIVE METABOLIC PANEL - Abnormal; Notable for the following:    Glucose, Bld 101 (*)    Albumin 3.4 (*)    Alkaline Phosphatase 119 (*)    All other components within normal limits  URINALYSIS, ROUTINE W REFLEX MICROSCOPIC  DIFFERENTIAL  LIPASE, BLOOD  OCCULT BLOOD X 1 CARD TO LAB, STOOL   Ct Abdomen Pelvis Wo Contrast  09/16/2011  *RADIOLOGY REPORT*  Clinical Data: Abdominal pain, nausea, vomiting and diarrhea.  CT ABDOMEN AND PELVIS WITHOUT CONTRAST  Technique:  Multidetector CT imaging of the abdomen and pelvis was performed following the standard protocol without intravenous contrast.  Comparison: CT of the abdomen and pelvis performed 01/17/2011  Findings: Minimal bibasilar atelectasis is noted.  The liver and spleen are unremarkable in appearance.  The patient is status post cholecystectomy, with clips noted along the gallbladder fossa.  The pancreas and adrenal glands are unremarkable.  There is incomplete rotation of the right kidney; the kidneys are otherwise unremarkable in appearance.  There is no evidence of hydronephrosis.  No renal or ureteral stones are seen.  No perinephric stranding is appreciated.  The ileostomy at the right mid abdomen is unremarkable in appearance; there is no evidence of bowel obstruction.  There is a stable appearing fascial defect at the site of prior left-sided  ileostomy, with chronic herniation of small bowel.  The remaining visualized small bowel is grossly unremarkable in appearance.  No free fluid is identified.  The small bowel is unremarkable in appearance.  The stomach is within normal limits.  No acute vascular abnormalities are seen.  The patient is status post colectomy.  Postoperative change is noted in the presacral region.  The bladder is mildly distended and grossly unremarkable in appearance.  The patient is status post hysterectomy; no suspicious adnexal masses are seen.  No inguinal lymphadenopathy is seen.  No acute osseous abnormalities are identified.  IMPRESSION:  1.  No acute abnormalities seen within the abdomen or pelvis. 2.  Ileostomy at the right mid abdomen is unremarkable in appearance; no evidence of bowel obstruction.  Stable appearance to the bowel herniation at the site of prior left-sided ileostomy takedown.  Original Report Authenticated By: Tonia Ghent, M.D.   Dg Abd Acute W/chest  09/16/2011  *RADIOLOGY REPORT*  Clinical Data: Abdominal pain, vomiting and diarrhea.  ACUTE ABDOMEN SERIES (ABDOMEN 2 VIEW & CHEST 1 VIEW)  Comparison: 07/16/2010  Findings: Cardiomegaly is noted. There is no evidence of airspace disease, pleural effusion or pneumothorax.  A focally dilated fluid and gas-filled loop of small bowel within the right abdomen is noted. No other dilated bowel loops or evidence of pneumoperitoneum noted. Clips within the right abdomen and pelvis are noted. No suspicious calcifications are identified. No acute bony abnormalities are present.  IMPRESSION: Nonspecific focally dilated fluid gas-filled loop of small bowel within the right abdomen. This may represent a focal ileus. Early or developing bowel obstruction is considered less likely.  No evidence of pneumoperitoneum.  Cardiomegaly without evidence of acute cardiopulmonary disease.  Original Report Authenticated By: Rosendo Gros, M.D.   I reviewed the above plain films  as well as CT scan myself.  1. Nausea vomiting and diarrhea   2. Abdominal pain     4:04 AM Patient is feeling improved at this time. CT scan is reassuring that there is no bowel obstruction and no obvious evidence of Crohn's disease flare although I understand that no contrast was provided. Patient otherwise has a soft abdomen at this point no further episodes of vomiting. She feels comfortable going home at this time. She does have analgesics as home already. He'll give her a prescription for antibiotics for home as well. Patient reports she'll be able to followup with her primary care physician or GI physician sometime this week.  MDM   A focal loop of dilated intestine on plain films. Patient did feel improved after initial round of medications. She tried a by mouth challenge, however this reproduced pain. Hemoccult of her diarrheal stool from ostomy shows that it is heme positive. Her white count is not elevated here. Due to the findings of positive Hemoccult as well as abnormal plain films, I plan is to obtain a CT scan. However we were only able to get a small 24-gauge IV. I believe a noncontrast CT is still reasonable to ensure that she does not again have bowel obstruction.        Gavin Pound. Autumm Hattery, MD 09/16/11 1610

## 2011-09-16 NOTE — Discharge Instructions (Signed)

## 2011-09-16 NOTE — ED Notes (Signed)
Pt made aware that still awaiting CT results. Md made aware that pt needs to get home so her husband can get to work.

## 2011-09-16 NOTE — ED Notes (Signed)
Patient transported to CT 

## 2011-12-01 ENCOUNTER — Emergency Department (HOSPITAL_BASED_OUTPATIENT_CLINIC_OR_DEPARTMENT_OTHER)
Admission: EM | Admit: 2011-12-01 | Discharge: 2011-12-02 | Disposition: A | Discharge status: Home / Self Care | Payer: BC Managed Care – PPO | Attending: Emergency Medicine | Admitting: Emergency Medicine

## 2011-12-01 ENCOUNTER — Encounter (HOSPITAL_BASED_OUTPATIENT_CLINIC_OR_DEPARTMENT_OTHER): Payer: Self-pay | Admitting: *Deleted

## 2011-12-01 DIAGNOSIS — R109 Unspecified abdominal pain: Secondary | ICD-10-CM

## 2011-12-01 DIAGNOSIS — Z87442 Personal history of urinary calculi: Secondary | ICD-10-CM | POA: Insufficient documentation

## 2011-12-01 DIAGNOSIS — Z79899 Other long term (current) drug therapy: Secondary | ICD-10-CM | POA: Insufficient documentation

## 2011-12-01 DIAGNOSIS — K5090 Crohn's disease, unspecified, without complications: Secondary | ICD-10-CM | POA: Insufficient documentation

## 2011-12-01 DIAGNOSIS — F17200 Nicotine dependence, unspecified, uncomplicated: Secondary | ICD-10-CM | POA: Insufficient documentation

## 2011-12-01 DIAGNOSIS — M109 Gout, unspecified: Secondary | ICD-10-CM | POA: Insufficient documentation

## 2011-12-01 DIAGNOSIS — F411 Generalized anxiety disorder: Secondary | ICD-10-CM | POA: Insufficient documentation

## 2011-12-01 LAB — URINALYSIS, ROUTINE W REFLEX MICROSCOPIC
Bilirubin Urine: NEGATIVE
Glucose, UA: NEGATIVE mg/dL
Hgb urine dipstick: NEGATIVE
Ketones, ur: NEGATIVE mg/dL
Leukocytes, UA: NEGATIVE
Protein, ur: NEGATIVE mg/dL
pH: 5.5 (ref 5.0–8.0)

## 2011-12-01 MED ORDER — SODIUM CHLORIDE 0.9 % IV SOLN
INTRAVENOUS | Status: DC
  Administered 2011-12-02: via INTRAVENOUS

## 2011-12-01 MED ORDER — ONDANSETRON HCL 4 MG/2ML IJ SOLN
4.0000 mg | Freq: Once | INTRAMUSCULAR | Status: AC
  Administered 2011-12-02: 4 mg via INTRAVENOUS
  Filled 2011-12-01: qty 2

## 2011-12-01 MED ORDER — FENTANYL CITRATE 0.05 MG/ML IJ SOLN
100.0000 ug | Freq: Once | INTRAMUSCULAR | Status: AC
  Administered 2011-12-02: 100 ug via INTRAVENOUS
  Filled 2011-12-01: qty 2

## 2011-12-01 MED ORDER — KETOROLAC TROMETHAMINE 15 MG/ML IJ SOLN
15.0000 mg | Freq: Once | INTRAMUSCULAR | Status: AC
  Administered 2011-12-02: 15 mg via INTRAVENOUS
  Filled 2011-12-01: qty 1

## 2011-12-01 NOTE — ED Notes (Signed)
Pt c/o right flank pain. HX kidney stones

## 2011-12-01 NOTE — ED Provider Notes (Signed)
History     CSN: [id number]  Arrival date & time 12/01/11  2317   First MD Initiated Contact with Patient 12/01/11 2348      Chief Complaint  Patient presents with  . Flank Pain    (Consider location/radiation/quality/duration/timing/severity/associated sxs/prior treatment) HPI This is a 46 year old white female with a history of kidney stones. She is here with right flank pain that began about 4 PM. The pain is characterized as like previous kidney stones. It is colicky. It is moderate to severe. It is not worse with movement. She's been nauseated but has not vomited. She's not aware of any blood in her urine. She denies fever or chills.  Past Medical History  Diagnosis Date  . Kidney stones   . Crohn disease   . Gout   . Kidney stones   . Anxiety   . Bowel obstruction     Past Surgical History  Procedure Date  . Cesarean section   . Colostomy   . Hernia repair   . Cholecystectomy   . Appendectomy   . Abdominal hysterectomy     History reviewed. No pertinent family history.  History  Substance Use Topics  . Smoking status: Current Everyday Smoker  . Smokeless tobacco: Not on file  . Alcohol Use: No    OB History    Grav Para Term Preterm Abortions TAB SAB Ect Mult Living                  Review of Systems  All other systems reviewed and are negative.    Allergies  Erythromycin; Morphine and related; Flagyl; Aspirin; and Penicillins  Home Medications   Current Outpatient Rx  Name Route Sig Dispense Refill  . ALBUTEROL 90 MCG/ACT IN AERS Inhalation Inhale 1 puff into the lungs 2 (two) times daily as needed. For shortness of breath     . ALBUTEROL IN Inhalation Inhale into the lungs.      . ALLOPURINOL 100 MG PO TABS Oral Take 100 mg by mouth daily.      . ALLOPURINOL PO Oral Take by mouth.      . OXYCODONE-ACETAMINOPHEN 10-325 MG PO TABS Oral Take 1 tablet by mouth every 6 (six) hours as needed.        BP 110/81  Pulse 74  Temp 98.2 F (36.8  C) (Oral)  Resp 16  Ht 5\' 4"  (1.626 m)  Wt 199 lb (90.266 kg)  BMI 34.16 kg/m2  SpO2 100%  Physical Exam General: Well-developed, well-nourished female in no acute distress; appears uncomfortable; appearance consistent with age of record HENT: normocephalic, atraumatic Eyes: Normal per Neck: supple Heart: regular rate and rhythm Lungs: clear to auscultation bilaterally Abdomen: soft; nondistended; nontender; bowel sounds present GU: Right flank tenderness Extremities: No deformity; full range of motion; pulses normal; no edema Neurologic: Awake, alert and oriented; motor function intact in all extremities and symmetric; no facial droop Skin: Warm and dry Psychiatric: Normal mood and affect    ED Course  Procedures (including critical care time)     MDM   Nursing notes and vitals signs, including pulse oximetry, reviewed.  Summary of this visit's results, reviewed by myself:  Labs:  Results for orders placed during the hospital encounter of 12/01/11  URINALYSIS, ROUTINE W REFLEX MICROSCOPIC      Component Value Range   Color, Urine YELLOW  YELLOW   APPearance CLEAR  CLEAR   Specific Gravity, Urine 1.012  1.005 - 1.030   pH 5.5  5.0 - 8.0   Glucose, UA NEGATIVE  NEGATIVE mg/dL   Hgb urine dipstick NEGATIVE  NEGATIVE   Bilirubin Urine NEGATIVE  NEGATIVE   Ketones, ur NEGATIVE  NEGATIVE mg/dL   Protein, ur NEGATIVE  NEGATIVE mg/dL   Urobilinogen, UA 0.2  0.0 - 1.0 mg/dL   Nitrite NEGATIVE  NEGATIVE   Leukocytes, UA NEGATIVE  NEGATIVE    Imaging Studies: Ct Abdomen Pelvis Wo Contrast  12/02/2011  *RADIOLOGY REPORT*  Clinical Data:  Right-sided flank pain.  Nausea vomiting.  History of renal stones Crohn's disease.  Partial large and small bowel resection/colostomy.  Hysterectomy.  Appendectomy. Cholecystectomy.  CT ABDOMEN AND PELVIS WITHOUT CONTRAST (CT UROGRAM)  Technique: Contiguous axial images of the abdomen and pelvis without oral or intravenous contrast were  obtained.  Comparison: 09/16/2011  Findings:  Exam is limited for evaluation of entities other than urinary tract calculi due to lack of oral or intravenous contrast.   Mild volume loss within the lingula.  Mild cardiomegaly without pericardial or pleural effusion.  Hepatomegaly, 20.2 cm cranial caudal.  No focal liver lesions. Normal spleen.  Normal stomach.  The descending duodenum is similarly prominent on image 38, without cause identified.  Normal pancreas. Cholecystectomy without biliary ductal dilatation. Normal adrenal glands. No renal calculi or hydronephrosis.  No hydroureter or ureteric stone.  No retroperitoneal or retrocrural adenopathy.  There may be a rectal stump.  Status post colectomy.  Small bowel containing left anterior pelvic wall hernia is without obstruction on image 45.  No evidence of strangulation.  Right lower quadrant ileostomy is similar in location.  There are probable adhesions, with anterior position of small bowel loops.  No obstruction or ascites.  No evidence of active enteritis.  No pelvic adenopathy.  Air within urinary bladder.  Hysterectomy. Similar presacral scarring/fascial thickening. No adnexal mass or significant free fluid. No acute osseous abnormality.  IMPRESSION:  1.  No urinary tract calculi or hydronephrosis. 2.  Similar appearance of the gastrointestinal tract.  Right-sided abdominal ileostomy and left ventral abdominal wall nonobstructive small bowel containing hernia.  Prior colectomy. 3.  Air within the urinary bladder.  Correlate with instrumentation. 4.  Hepatomegaly.  Original Report Authenticated By: Consuello Bossier, M.D.    1:45 AM Pain relieved with IV meds. Patient advised of CT findings of air in the bladder. There is no evidence of acute urinary tract infection. Patient has followup with her primary care physician on the 18th and will address this at that time.        Hanley Seamen, MD 12/02/11 [phone]

## 2011-12-02 ENCOUNTER — Emergency Department (HOSPITAL_BASED_OUTPATIENT_CLINIC_OR_DEPARTMENT_OTHER): Payer: BC Managed Care – PPO

## 2011-12-02 MED ORDER — FENTANYL CITRATE 0.05 MG/ML IJ SOLN
100.0000 ug | Freq: Once | INTRAMUSCULAR | Status: AC
  Administered 2011-12-02: 100 ug via INTRAVENOUS

## 2011-12-02 MED ORDER — OXYCODONE-ACETAMINOPHEN 5-325 MG PO TABS: 1.0000 | ORAL_TABLET | Freq: Four times a day (QID) | ORAL | Status: AC | PRN

## 2011-12-02 MED ORDER — FENTANYL CITRATE 0.05 MG/ML IJ SOLN: 100.0000 ug | Freq: Once | INTRAMUSCULAR | Status: DC

## 2011-12-02 MED ORDER — FENTANYL CITRATE 0.05 MG/ML IJ SOLN
INTRAMUSCULAR | Status: AC
  Filled 2011-12-02: qty 2

## 2012-06-09 ENCOUNTER — Emergency Department (HOSPITAL_BASED_OUTPATIENT_CLINIC_OR_DEPARTMENT_OTHER): Payer: BC Managed Care – PPO

## 2012-06-09 ENCOUNTER — Encounter (HOSPITAL_BASED_OUTPATIENT_CLINIC_OR_DEPARTMENT_OTHER): Payer: Self-pay | Admitting: *Deleted

## 2012-06-09 ENCOUNTER — Emergency Department (HOSPITAL_BASED_OUTPATIENT_CLINIC_OR_DEPARTMENT_OTHER)
Admission: EM | Admit: 2012-06-09 | Discharge: 2012-06-10 | Disposition: A | Discharge status: Home / Self Care | Payer: BC Managed Care – PPO | Attending: Emergency Medicine | Admitting: Emergency Medicine

## 2012-06-09 DIAGNOSIS — Z79899 Other long term (current) drug therapy: Secondary | ICD-10-CM | POA: Insufficient documentation

## 2012-06-09 DIAGNOSIS — R109 Unspecified abdominal pain: Secondary | ICD-10-CM | POA: Insufficient documentation

## 2012-06-09 DIAGNOSIS — IMO0002 Reserved for concepts with insufficient information to code with codable children: Secondary | ICD-10-CM

## 2012-06-09 DIAGNOSIS — K5090 Crohn's disease, unspecified, without complications: Secondary | ICD-10-CM | POA: Insufficient documentation

## 2012-06-09 DIAGNOSIS — Z8659 Personal history of other mental and behavioral disorders: Secondary | ICD-10-CM | POA: Insufficient documentation

## 2012-06-09 DIAGNOSIS — R110 Nausea: Secondary | ICD-10-CM | POA: Insufficient documentation

## 2012-06-09 DIAGNOSIS — M109 Gout, unspecified: Secondary | ICD-10-CM | POA: Insufficient documentation

## 2012-06-09 DIAGNOSIS — Z933 Colostomy status: Secondary | ICD-10-CM | POA: Insufficient documentation

## 2012-06-09 DIAGNOSIS — Z87442 Personal history of urinary calculi: Secondary | ICD-10-CM | POA: Insufficient documentation

## 2012-06-09 DIAGNOSIS — Z9889 Other specified postprocedural states: Secondary | ICD-10-CM | POA: Insufficient documentation

## 2012-06-09 DIAGNOSIS — Z87448 Personal history of other diseases of urinary system: Secondary | ICD-10-CM

## 2012-06-09 DIAGNOSIS — Z8719 Personal history of other diseases of the digestive system: Secondary | ICD-10-CM | POA: Insufficient documentation

## 2012-06-09 DIAGNOSIS — R319 Hematuria, unspecified: Secondary | ICD-10-CM

## 2012-06-09 DIAGNOSIS — F17200 Nicotine dependence, unspecified, uncomplicated: Secondary | ICD-10-CM | POA: Insufficient documentation

## 2012-06-09 LAB — URINALYSIS, ROUTINE W REFLEX MICROSCOPIC
Bilirubin Urine: NEGATIVE
Glucose, UA: NEGATIVE mg/dL
Nitrite: NEGATIVE
Specific Gravity, Urine: 1.009 (ref 1.005–1.030)
pH: 5 (ref 5.0–8.0)

## 2012-06-09 LAB — BASIC METABOLIC PANEL
BUN: 15 mg/dL (ref 6–23)
Calcium: 9.8 mg/dL (ref 8.4–10.5)
Creatinine, Ser: 0.9 mg/dL (ref 0.50–1.10)
GFR calc Af Amer: 88 mL/min — ABNORMAL LOW (ref >90)

## 2012-06-09 LAB — CBC WITH DIFFERENTIAL/PLATELET
Basophils Absolute: 0 10*3/uL (ref 0.0–0.1)
Basophils Relative: 1 % (ref 0–1)
Eosinophils Relative: 2 % (ref 0–5)
HCT: 36.6 % (ref 36.0–46.0)
Hemoglobin: 12.3 g/dL (ref 12.0–15.0)
MCH: 32.2 pg (ref 26.0–34.0)
MCHC: 33.6 g/dL (ref 30.0–36.0)
MCV: 95.8 fL (ref 78.0–100.0)
Monocytes Absolute: 0.4 10*3/uL (ref 0.1–1.0)
Monocytes Relative: 6 % (ref 3–12)
RDW: 13.5 % (ref 11.5–15.5)

## 2012-06-09 LAB — URINE MICROSCOPIC-ADD ON

## 2012-06-09 MED ORDER — SODIUM CHLORIDE 0.9 % IV BOLUS (SEPSIS)
500.0000 mL | Freq: Once | INTRAVENOUS | Status: AC
  Administered 2012-06-09: 500 mL via INTRAVENOUS

## 2012-06-09 MED ORDER — KETOROLAC TROMETHAMINE 15 MG/ML IJ SOLN
15.0000 mg | Freq: Once | INTRAMUSCULAR | Status: AC
  Administered 2012-06-09: 15 mg via INTRAVENOUS
  Filled 2012-06-09: qty 1

## 2012-06-09 MED ORDER — HYDROMORPHONE HCL PF 1 MG/ML IJ SOLN
0.5000 mg | Freq: Once | INTRAMUSCULAR | Status: AC
  Administered 2012-06-09: 0.5 mg via INTRAVENOUS
  Filled 2012-06-09: qty 1

## 2012-06-09 MED ORDER — DIPHENHYDRAMINE HCL 50 MG/ML IJ SOLN
25.0000 mg | Freq: Once | INTRAMUSCULAR | Status: AC
  Administered 2012-06-09: 25 mg via INTRAVENOUS
  Filled 2012-06-09: qty 1

## 2012-06-09 MED ORDER — ONDANSETRON HCL 4 MG/2ML IJ SOLN
4.0000 mg | Freq: Once | INTRAMUSCULAR | Status: AC
  Administered 2012-06-09: 4 mg via INTRAVENOUS
  Filled 2012-06-09: qty 2

## 2012-06-09 NOTE — ED Notes (Signed)
Family at bedside. 

## 2012-06-09 NOTE — ED Notes (Signed)
Thinks she may have a kidney stone. Lower back pain since this am. Gradual worsening. Vomiting.

## 2012-06-09 NOTE — ED Notes (Signed)
Pt. Asking  For more pain meds.  Informed Dr Rulon Abide and he will speak with the Pt.

## 2012-06-10 MED ORDER — OXYCODONE-ACETAMINOPHEN 10-325 MG PO TABS: 1.0000 | ORAL_TABLET | Freq: Four times a day (QID) | ORAL | Status: AC | PRN

## 2012-06-10 MED ORDER — HYDROMORPHONE HCL PF 1 MG/ML IJ SOLN
1.0000 mg | Freq: Once | INTRAMUSCULAR | Status: AC
  Administered 2012-06-10: 1 mg via INTRAVENOUS
  Filled 2012-06-10: qty 1

## 2012-06-10 NOTE — ED Provider Notes (Signed)
History     CSN: [id number]  Arrival date & time 06/09/12  2106   First MD Initiated Contact with Patient 06/09/12 2152      Chief Complaint  Patient presents with  . Abdominal Pain    (Consider location/radiation/quality/duration/timing/severity/associated sxs/prior treatment) [place]IP[place] Meyers is a 46 y.o. female an extensive medical history consistent of kidney stones, Crohn disease status post colectomy, gout and bowel obstruction presents with right flank pain. She says this feels like a kidney stone, pain is 12 out of 10, sharp, crampy, intermittent and radiates to the front of the right lower quadrant. Patient has had no bloody or black stools, ostomy output has been normal, no problems with ostomy, no fevers or chills, chest pain or shortness of breath. She's had some mild nausea with the pain. She says this feels like stones in the past.   Past Medical History  Diagnosis Date  . Kidney stones   . Crohn disease   . Gout   . Kidney stones   . Anxiety   . Bowel obstruction     Past Surgical History  Procedure Date  . Cesarean section   . Colostomy   . Hernia repair   . Cholecystectomy   . Appendectomy   . Abdominal hysterectomy     No family history on file.  History  Substance Use Topics  . Smoking status: Current Every Day Smoker  . Smokeless tobacco: Not on file  . Alcohol Use: No    OB History    Grav Para Term Preterm Abortions TAB SAB Ect Mult Living                  Review of Systems At least 10pt or greater review of systems completed and are negative except where specified in the HPI.  Allergies  Erythromycin; Morphine and related; Flagyl; Aspirin; and Penicillins  Home Medications   Current Outpatient Rx  Name  Route  Sig  Dispense  Refill  . ALBUTEROL 90 MCG/ACT IN AERS   Inhalation   Inhale 1 puff into the lungs 2 (two) times daily as needed. For shortness of breath          . ALBUTEROL IN   Inhalation   Inhale into the lungs.            . ALLOPURINOL 100 MG PO TABS   Oral   Take 100 mg by mouth daily.           . ALLOPURINOL PO   Oral   Take by mouth.           . OXYCODONE-ACETAMINOPHEN 10-325 MG PO TABS   Oral   Take 1 tablet by mouth every 6 (six) hours as needed.             BP 106/91  Pulse 102  Temp 98.3 F (36.8 C) (Oral)  Resp 22  SpO2 100%  Physical Exam  Nursing notes reviewed.  Electronic medical record reviewed. VITAL SIGNS:   Filed Vitals:   06/09/12 2114  BP: 106/91  Pulse: 102  Temp: 98.3 F (36.8 C)  TempSrc: Oral  Resp: 22  SpO2: 100%   CONSTITUTIONAL: Awake, oriented, appears non-toxic HENT: Atraumatic, normocephalic, oral mucosa pink and moist, airway patent. Nares patent without drainage. External ears normal. EYES: Conjunctiva clear, EOMI, PERRLA NECK: Trachea midline, non-tender, supple CARDIOVASCULAR: Normal heart rate, Normal rhythm, No murmurs, rubs, gallops PULMONARY/CHEST: Clear to auscultation, no rhonchi, wheezes, or rales. Symmetrical breath sounds. Non-tender. ABDOMINAL: Non-distended,  soft, obese, non-tender - no rebound or guarding.  Ostomy right lower quadrant - stoma is pink and patent. BS normal. No tenderness in the flank  NEUROLOGIC: Non-focal, moving all four extremities, no gross sensory or motor deficits. EXTREMITIES: No clubbing, cyanosis, or edema SKIN: Warm, Dry, No erythema, No rash  ED Course  Procedures (including critical care time)  Labs Reviewed  URINALYSIS, ROUTINE W REFLEX MICROSCOPIC - Abnormal; Notable for the following:    Color, Urine RED (*)  BIOCHEMICALS MAY BE AFFECTED BY COLOR   APPearance CLOUDY (*)     Hgb urine dipstick LARGE (*)     Leukocytes, UA TRACE (*)     All other components within normal limits  URINE MICROSCOPIC-ADD ON - Abnormal; Notable for the following:    Squamous Epithelial / LPF FEW (*)     All other components within normal limits  CBC WITH DIFFERENTIAL - Abnormal; Notable for the following:     RBC 3.82 (*)     All other components within normal limits  BASIC METABOLIC PANEL - Abnormal; Notable for the following:    GFR calc non Af Amer 76 (*)     GFR calc Af Amer 88 (*)     All other components within normal limits   Ct Abdomen Pelvis Wo Contrast  06/09/2012  *RADIOLOGY REPORT*  Clinical Data: Low back pain  CT ABDOMEN AND PELVIS WITHOUT CONTRAST  Technique:  Multidetector CT imaging of the abdomen and pelvis was performed following the standard protocol without intravenous contrast.  Comparison: 12/02/2011  Findings: Linear lingular opacity is likely an area of scarring or atelectasis.  Coronary artery calcification.  Nonspecific geographic low attenuation involving the left hepatic lobe, perhaps corresponding to fatty infiltration of the left lobe. Absent gallbladder.  No biliary ductal dilatation.  Unremarkable spleen, pancreas, adrenal gland.  Symmetric renal size.  No hydronephrosis or hydroureter and no urinary tract calculi identified.  Presacral surgical clips. Status post colectomy with right lower quadrant ileostomy.  There is a small bowel containing left anterior wall hernia, similar to prior.  No CT evidence for obstruction or incarceration.  No free intraperitoneal air or fluid.  No lymphadenopathy.  There is scattered atherosclerotic calcification of the aorta and its branches. No aneurysmal dilatation.  Further vascular evaluation not possible without intravenous contrast.  Thin-walled bladder.  Absent uterus.  No adnexal mass.  No acute osseous finding.  IMPRESSION: No hydronephrosis or urinary tract calculi.  Hypoattenuation of the left hepatic lobe may reflect fatty infiltration.  Status post colectomy with right lower quadrant ileostomy. There is an anterior abdominal wall small bowel containing hernia on the left, similar to prior.  No CT evidence for obstruction or incarceration.   Original Report Authenticated By: Jearld Lesch, M.D.      1. Abdominal pain   2.  Flank pain   3. Hematuria   4. H/O renal colic   5. H/O Crohn's disease   6. H/O colostomy       MDM  Sarah Meyers is a 46 y.o. female patient presents with possible kidney stone and flank pain, CT reviewed from June shows no kidney stones seen in the kidneys. Patient has a history of Crohn disease but says this does not feel like her Crohn's flareup she says she has not had any problems since having her colon removed. Urinalysis obtained in triage shows irregular and with too numerous to count red blood cells and a large hemoglobin.  CT of the abdomen  and pelvis obtained showing no hydronephrosis or urinary tract calculi. Patient's kidney function is unremarkable with a BUN of 15 and creatinine 0.90-even if there were a radiolucent stone, there is no effect on her BUN, creatinine and no hydronephrosis. No evidence of infection.  Patient's pain is controlled-she was sent home with pain medicine told to followup with urology. She'll see to follow up with her primary care physician. No evidence of Crohn's flare and CT.    I explained the diagnosis and have given explicit precautions to return to the ER including any other new or worsening symptoms. The patient understands and accepts the medical plan as it's been dictated and I have answered their questions. Discharge instructions concerning home care and prescriptions have been given.  The patient is STABLE and is discharged to home in good condition.         Jones Skene, MD 06/10/12 [phone]

## 2012-07-12 ENCOUNTER — Encounter (HOSPITAL_BASED_OUTPATIENT_CLINIC_OR_DEPARTMENT_OTHER): Payer: Self-pay | Admitting: *Deleted

## 2012-07-12 ENCOUNTER — Emergency Department (HOSPITAL_BASED_OUTPATIENT_CLINIC_OR_DEPARTMENT_OTHER)
Admission: EM | Admit: 2012-07-12 | Discharge: 2012-07-13 | Disposition: A | Discharge status: Home / Self Care | Payer: BC Managed Care – PPO | Attending: Emergency Medicine | Admitting: Emergency Medicine

## 2012-07-12 ENCOUNTER — Emergency Department (HOSPITAL_BASED_OUTPATIENT_CLINIC_OR_DEPARTMENT_OTHER): Payer: BC Managed Care – PPO

## 2012-07-12 DIAGNOSIS — Z9049 Acquired absence of other specified parts of digestive tract: Secondary | ICD-10-CM | POA: Insufficient documentation

## 2012-07-12 DIAGNOSIS — Z79899 Other long term (current) drug therapy: Secondary | ICD-10-CM | POA: Insufficient documentation

## 2012-07-12 DIAGNOSIS — Z87442 Personal history of urinary calculi: Secondary | ICD-10-CM | POA: Insufficient documentation

## 2012-07-12 DIAGNOSIS — N390 Urinary tract infection, site not specified: Secondary | ICD-10-CM | POA: Insufficient documentation

## 2012-07-12 DIAGNOSIS — Z8719 Personal history of other diseases of the digestive system: Secondary | ICD-10-CM | POA: Insufficient documentation

## 2012-07-12 DIAGNOSIS — R319 Hematuria, unspecified: Secondary | ICD-10-CM

## 2012-07-12 DIAGNOSIS — F411 Generalized anxiety disorder: Secondary | ICD-10-CM | POA: Insufficient documentation

## 2012-07-12 DIAGNOSIS — K5289 Other specified noninfective gastroenteritis and colitis: Secondary | ICD-10-CM | POA: Insufficient documentation

## 2012-07-12 DIAGNOSIS — F17200 Nicotine dependence, unspecified, uncomplicated: Secondary | ICD-10-CM | POA: Insufficient documentation

## 2012-07-12 DIAGNOSIS — R109 Unspecified abdominal pain: Secondary | ICD-10-CM

## 2012-07-12 DIAGNOSIS — M109 Gout, unspecified: Secondary | ICD-10-CM | POA: Insufficient documentation

## 2012-07-12 HISTORY — DX: Calculus of kidney: N20.0

## 2012-07-12 LAB — URINE MICROSCOPIC-ADD ON

## 2012-07-12 LAB — URINALYSIS, ROUTINE W REFLEX MICROSCOPIC
Bilirubin Urine: NEGATIVE
Glucose, UA: NEGATIVE mg/dL
Ketones, ur: 15 mg/dL — AB
Nitrite: NEGATIVE
Protein, ur: 30 mg/dL — AB
pH: 5.5 (ref 5.0–8.0)

## 2012-07-12 MED ORDER — LACTATED RINGERS IV BOLUS (SEPSIS)
1000.0000 mL | Freq: Once | INTRAVENOUS | Status: AC
  Administered 2012-07-13: 1000 mL via INTRAVENOUS

## 2012-07-12 MED ORDER — ONDANSETRON HCL 4 MG/2ML IJ SOLN
4.0000 mg | Freq: Once | INTRAMUSCULAR | Status: AC
  Administered 2012-07-13: 4 mg via INTRAVENOUS
  Filled 2012-07-12: qty 2

## 2012-07-12 MED ORDER — HYDROMORPHONE HCL PF 1 MG/ML IJ SOLN
1.0000 mg | Freq: Once | INTRAMUSCULAR | Status: AC
  Administered 2012-07-13: 1 mg via INTRAVENOUS
  Filled 2012-07-12: qty 1

## 2012-07-12 NOTE — ED Notes (Signed)
Pt c/o right flank pain for few days. Pt states that she thinks she passed one earlier, has been seeing urologist for same in [place].

## 2012-07-12 NOTE — ED Provider Notes (Signed)
History     CSN: [id number]  Arrival date & time 07/12/12  2258   First MD Initiated Contact with Patient 07/12/12 2324      Chief Complaint  Patient presents with  . Flank Pain    (Consider location/radiation/quality/duration/timing/severity/associated sxs/prior treatment) HPI Sarah Meyers is a 47 y.o. female with a history of flank pain and chronic abdominal pain who is status post colectomy for Crohn's disease, whom I am familiar with and she saw her approximately one month ago for similar complaints, presents with right flank pain it started a couple of days ago, it is 9/10, sharp and stabbing, she says it was unresponsive to Tylenol, she's had some nausea but no vomiting no diarrhea, no fevers, no chills, no other abdominal pain. She says this does not feel like a Crohn's flare.  No lightheadedness, dizziness, chest pain, shortness of breath, recent illnesses. Ostomy output has been normal, there's been no blood. She's noticed no blood in her urine. No dysuria or frequency.   Past Medical History  Diagnosis Date  . Kidney stones   . Crohn disease   . Gout   . Kidney stones   . Anxiety   . Bowel obstruction   . Kidney stone     Past Surgical History  Procedure Date  . Cesarean section   . Colostomy   . Hernia repair   . Cholecystectomy   . Appendectomy   . Abdominal hysterectomy     History reviewed. No pertinent family history.  History  Substance Use Topics  . Smoking status: Current Every Day Smoker  . Smokeless tobacco: Not on file  . Alcohol Use: No    OB History    Grav Para Term Preterm Abortions TAB SAB Ect Mult Living                  Review of Systems At least 10pt or greater review of systems completed and are negative except where specified in the HPI.  Allergies  Erythromycin; Morphine and related; Flagyl; Aspirin; and Penicillins  Home Medications   Current Outpatient Rx  Name  Route  Sig  Dispense  Refill  . ALBUTEROL 90 MCG/ACT IN  AERS   Inhalation   Inhale 1 puff into the lungs 2 (two) times daily as needed. For shortness of breath          . ALBUTEROL IN   Inhalation   Inhale into the lungs.           . ALLOPURINOL 100 MG PO TABS   Oral   Take 100 mg by mouth daily.           . ALLOPURINOL PO   Oral   Take by mouth.           . CLONAZEPAM 1 MG PO TABS   Oral   Take 1 mg by mouth 2 (two) times daily as needed.         Marland Kitchen TAMSULOSIN HCL 0.4 MG PO CAPS   Oral   Take 0.4 mg by mouth daily.         . OXYCODONE-ACETAMINOPHEN 10-325 MG PO TABS   Oral   Take 1-2 tablets by mouth every 6 (six) hours as needed for pain.   17 tablet   0     BP 134/85  Pulse 93  Temp 97.4 F (36.3 C) (Oral)  Resp 20  SpO2 100%  Physical Exam  Nursing notes reviewed.  Electronic medical record reviewed. VITAL SIGNS:  Filed Vitals:   07/12/12 2311  BP: 134/85  Pulse: 93  Temp: 97.4 F (36.3 C)  TempSrc: Oral  Resp: 20  SpO2: 100%   CONSTITUTIONAL: Awake, oriented, appears non-toxic, smells of cigarettes HENT: Atraumatic, normocephalic, oral mucosa pink and moist, airway patent. Nares patent without drainage. External ears normal. EYES: Conjunctiva clear, EOMI, PERRLA NECK: Trachea midline, non-tender, supple CARDIOVASCULAR: Normal heart rate, Normal rhythm, No murmurs, rubs, gallops PULMONARY/CHEST: Clear to auscultation, no rhonchi, wheezes, or rales. Symmetrical breath sounds. Non-tender. ABDOMINAL: Non-distended, obese, soft, non-tender - no rebound or guarding. Ostomy in place in the right abdomen - nonbloody, stoma is pink and healthy in appearance. Surrounding the ostomy there is some healing skin that is slightly scaled no erythema or drainage. BS normal. NEUROLOGIC: Non-focal, moving all four extremities, no gross sensory or motor deficits. EXTREMITIES: No clubbing, cyanosis, or edema. Bandaged fresh cut with small blood spot on gauze on right forearm. SKIN: Warm, Dry, No erythema, No  rash  ED Course  Procedures (including critical care time)  Labs Reviewed  URINALYSIS, ROUTINE W REFLEX MICROSCOPIC - Abnormal; Notable for the following:    Color, Urine RED (*)  BIOCHEMICALS MAY BE AFFECTED BY COLOR   APPearance CLOUDY (*)     Hgb urine dipstick LARGE (*)     Ketones, ur 15 (*)     Protein, ur 30 (*)     Leukocytes, UA SMALL (*)     All other components within normal limits  URINE MICROSCOPIC-ADD ON - Abnormal; Notable for the following:    Squamous Epithelial / LPF MANY (*)     Bacteria, UA MANY (*)     All other components within normal limits  COMPREHENSIVE METABOLIC PANEL - Abnormal; Notable for the following:    Potassium 3.2 (*)     Albumin 3.2 (*)     Alkaline Phosphatase 137 (*)     Total Bilirubin 0.1 (*)     All other components within normal limits  CBC - Abnormal; Notable for the following:    RBC 3.76 (*)     Hemoglobin 11.9 (*)     HCT 35.6 (*)     All other components within normal limits  LIPASE, BLOOD  URINE CULTURE   Dg Abd 2 Views  07/13/2012  *RADIOLOGY REPORT*  Clinical Data: Lower abdominal pain, radiating to the lower right side of the back.  ABDOMEN - 2 VIEW  Comparison: Chest and abdominal radiographs performed 09/16/2011  Findings: The visualized bowel gas pattern is unremarkable. Scattered air and stool filled loops of colon are seen; no abnormal dilatation of small bowel loops is seen to suggest small bowel obstruction.  No free intra-abdominal air is identified, though evaluation for free air is limited on a single supine view.  An ostomy site is noted at the right lower quadrant.  The visualized osseous structures are within normal limits; the sacroiliac joints are unremarkable in appearance.  The visualized lung bases are essentially clear.  Scattered clips are noted at the right upper quadrant, and within the pelvis.  The heart is mildly enlarged.  IMPRESSION:  1.  Unremarkable bowel gas pattern; no free intra-abdominal air seen. 2.   Mild cardiomegaly.   Original Report Authenticated By: Tonia Ghent, M.D.      1. Chronic flank pain   2. Hematuria   3. Chronic abdominal pain       MDM  Sarah Meyers is a 47 y.o. female presenting with some flank pain, patient has  history of chronic pain and has multiple prescriptions filled for pain medicine. Patient recently had 2 prescriptions filled on January 10 for oxycodone quantity of 120 pills total.  Patient's appearance is nontoxic, she is afebrile. She had a CAT scan about a month ago that I ordered, there was no stones on that study.  We'll obtain an x-ray of the abdomen flat and upright-although my clinical suspicion is extraordinarily low for an obstruction, or the fact that we may see a stone. Patient does have some hematuria, this seems to be chronic, and he may in fact be a false positive - my suspicion is that there may be some malingering going on and this may be manipulating the urine sample.  Pt had a fresh cut on her right forearm.  Labs are otherwise unremarkable. Slightly low K - however did not think he requires repletion at this time, patient says she cannot swallow potassium tablets anyway. Patient's urine will be cultured, there are 3-6 white cells as well as small leukocyte esterase and many bacteria, without dysuria or frequency I do not think this represents a urinary tract infection, however, given her chronic illness, will cover with Macrobid until culture results are back.  Do not think she's got pyelonephritis.  Patient has followup with her primary care physician on Friday.  I explained the diagnosis and have given explicit precautions to return to the ER including any other new or worsening symptoms. The patient understands and accepts the medical plan as it's been dictated and I have answered their questions. Discharge instructions concerning home care and prescriptions have been given.  The patient is STABLE and is discharged to home in good  condition.          Jones Skene, MD 07/13/12 [phone]

## 2012-07-13 LAB — COMPREHENSIVE METABOLIC PANEL
BUN: 10 mg/dL (ref 6–23)
CO2: 21 mEq/L (ref 19–32)
Calcium: 9.7 mg/dL (ref 8.4–10.5)
Chloride: 101 mEq/L (ref 96–112)
Creatinine, Ser: 0.7 mg/dL (ref 0.50–1.10)
GFR calc Af Amer: >90 mL/min (ref >90)
GFR calc non Af Amer: >90 mL/min (ref >90)
Total Bilirubin: 0.1 mg/dL — ABNORMAL LOW (ref 0.3–1.2)

## 2012-07-13 LAB — LIPASE, BLOOD: Lipase: 21 U/L (ref 11–59)

## 2012-07-13 LAB — CBC
Hemoglobin: 11.9 g/dL — ABNORMAL LOW (ref 12.0–15.0)
Platelets: 289 10*3/uL (ref 150–400)
RBC: 3.76 MIL/uL — ABNORMAL LOW (ref 3.87–5.11)
WBC: 7.2 10*3/uL (ref 4.0–10.5)

## 2012-07-13 MED ORDER — NITROFURANTOIN MONOHYD MACRO 100 MG PO CAPS: 100.0000 mg | ORAL_CAPSULE | Freq: Two times a day (BID) | ORAL | Status: AC

## 2012-07-13 MED ORDER — HYDROMORPHONE HCL PF 1 MG/ML IJ SOLN
1.0000 mg | Freq: Once | INTRAMUSCULAR | Status: AC
  Administered 2012-07-13: 1 mg via INTRAVENOUS
  Filled 2012-07-13: qty 1

## 2012-07-13 NOTE — ED Notes (Signed)
Patient transported to X-ray 

## 2012-07-15 LAB — URINE CULTURE

## 2012-07-16 NOTE — ED Notes (Signed)
+  Urine. Patient treated with Macrobid. Sensitive to same. Per protocol MD. °

## 2013-09-23 ENCOUNTER — Emergency Department (HOSPITAL_BASED_OUTPATIENT_CLINIC_OR_DEPARTMENT_OTHER): Payer: BC Managed Care – PPO

## 2013-09-23 ENCOUNTER — Encounter (HOSPITAL_BASED_OUTPATIENT_CLINIC_OR_DEPARTMENT_OTHER): Payer: Self-pay | Admitting: Emergency Medicine

## 2013-09-23 ENCOUNTER — Emergency Department (HOSPITAL_BASED_OUTPATIENT_CLINIC_OR_DEPARTMENT_OTHER)
Admission: EM | Admit: 2013-09-23 | Discharge: 2013-09-23 | Disposition: A | Discharge status: Home / Self Care | Payer: BC Managed Care – PPO | Attending: Emergency Medicine | Admitting: Emergency Medicine

## 2013-09-23 DIAGNOSIS — Z79899 Other long term (current) drug therapy: Secondary | ICD-10-CM | POA: Insufficient documentation

## 2013-09-23 DIAGNOSIS — Z87442 Personal history of urinary calculi: Secondary | ICD-10-CM | POA: Insufficient documentation

## 2013-09-23 DIAGNOSIS — F17200 Nicotine dependence, unspecified, uncomplicated: Secondary | ICD-10-CM | POA: Insufficient documentation

## 2013-09-23 DIAGNOSIS — M109 Gout, unspecified: Secondary | ICD-10-CM | POA: Insufficient documentation

## 2013-09-23 DIAGNOSIS — Z8719 Personal history of other diseases of the digestive system: Secondary | ICD-10-CM | POA: Insufficient documentation

## 2013-09-23 DIAGNOSIS — M549 Dorsalgia, unspecified: Secondary | ICD-10-CM | POA: Insufficient documentation

## 2013-09-23 DIAGNOSIS — Z880 Allergy status to penicillin: Secondary | ICD-10-CM | POA: Insufficient documentation

## 2013-09-23 DIAGNOSIS — F411 Generalized anxiety disorder: Secondary | ICD-10-CM | POA: Insufficient documentation

## 2013-09-23 LAB — URINALYSIS, ROUTINE W REFLEX MICROSCOPIC
Bilirubin Urine: NEGATIVE
GLUCOSE, UA: NEGATIVE mg/dL
HGB URINE DIPSTICK: NEGATIVE
KETONES UR: NEGATIVE mg/dL
Leukocytes, UA: NEGATIVE
Nitrite: NEGATIVE
PROTEIN: NEGATIVE mg/dL
Specific Gravity, Urine: 1.007 (ref 1.005–1.030)
Urobilinogen, UA: 0.2 mg/dL (ref 0.0–1.0)
pH: 5 (ref 5.0–8.0)

## 2013-09-23 MED ORDER — ONDANSETRON 4 MG PO TBDP
ORAL_TABLET | ORAL | Status: AC
  Administered 2013-09-23: 4 mg via ORAL
  Filled 2013-09-23: qty 1

## 2013-09-23 MED ORDER — ONDANSETRON HCL 4 MG/2ML IJ SOLN
4.0000 mg | Freq: Once | INTRAMUSCULAR | Status: DC
  Filled 2013-09-23: qty 2

## 2013-09-23 MED ORDER — KETOROLAC TROMETHAMINE 60 MG/2ML IM SOLN
60.0000 mg | Freq: Once | INTRAMUSCULAR | Status: AC
  Administered 2013-09-23: 60 mg via INTRAMUSCULAR
  Filled 2013-09-23: qty 2

## 2013-09-23 MED ORDER — ONDANSETRON 4 MG PO TBDP
4.0000 mg | ORAL_TABLET | Freq: Once | ORAL | Status: AC
  Administered 2013-09-23: 4 mg via ORAL

## 2013-09-23 MED ORDER — SODIUM CHLORIDE 0.9 % IV BOLUS (SEPSIS): 1000.0000 mL | Freq: Once | INTRAVENOUS | Status: DC

## 2013-09-23 NOTE — ED Provider Notes (Signed)
CSN: [id number]     Arrival date & time 09/23/13  1451 History   First MD Initiated Contact with Patient 09/23/13 1544     Chief Complaint  Patient presents with  . Flank Pain     (Consider location/radiation/quality/duration/timing/severity/associated sxs/prior Treatment) Patient is a [age] y.o. female presenting with flank pain. The history is provided by the patient. No language interpreter was used.  Flank Pain This is a new problem. The current episode started today. The problem occurs constantly. The problem has been gradually worsening. Associated symptoms include abdominal pain. Nothing aggravates the symptoms. She has tried nothing for the symptoms. The treatment provided moderate relief.   Pt complains of pain in her right flank.  Pt feels like she has a kidney stone.   Past Medical History  Diagnosis Date  . Kidney stones   . Crohn disease   . Gout   . Kidney stones   . Anxiety   . Bowel obstruction   . Kidney stone    Past Surgical History  Procedure Laterality Date  . Cesarean section    . Colostomy    . Hernia repair    . Cholecystectomy    . Appendectomy    . Abdominal hysterectomy     No family history on file. History  Substance Use Topics  . Smoking status: Current Every Day Smoker  . Smokeless tobacco: Not on file  . Alcohol Use: No   OB History   Grav Para Term Preterm Abortions TAB SAB Ect Mult Living                 Review of Systems  Gastrointestinal: Positive for abdominal pain.  Genitourinary: Positive for flank pain.  All other systems reviewed and are negative.      Allergies  Erythromycin; Morphine and related; Flagyl; Aspirin; and Penicillins  Home Medications   Current Outpatient Rx  Name  Route  Sig  Dispense  Refill  . albuterol (PROVENTIL,VENTOLIN) 90 MCG/ACT inhaler   Inhalation   Inhale 1 puff into the lungs 2 (two) times daily as needed. For shortness of breath          . ALBUTEROL IN   Inhalation   Inhale into the  lungs.           Marland Kitchen. allopurinol (ZYLOPRIM) 100 MG tablet   Oral   Take 100 mg by mouth daily.           . ALLOPURINOL PO   Oral   Take by mouth.           . clonazePAM (KLONOPIN) 1 MG tablet   Oral   Take 1 mg by mouth 2 (two) times daily as needed.         . nitrofurantoin, macrocrystal-monohydrate, (MACROBID) 100 MG capsule   Oral   Take 1 capsule (100 mg total) by mouth 2 (two) times daily.   10 capsule   0   . oxyCODONE-acetaminophen (PERCOCET) 10-325 MG per tablet   Oral   Take 1-2 tablets by mouth every 6 (six) hours as needed for pain.   17 tablet   0   . Tamsulosin HCl (FLOMAX) 0.4 MG CAPS   Oral   Take 0.4 mg by mouth daily.          BP 142/125  Pulse 90  Temp(Src) 98.6 F (37 C) (Oral)  Resp 24  SpO2 99% Physical Exam  Nursing note and vitals reviewed. Constitutional: She is oriented to person, place, and  time. She appears well-developed and well-nourished.  HENT:  Head: Normocephalic and atraumatic.  Eyes: Conjunctivae are normal.  Neck: Normal range of motion.  Cardiovascular: Normal rate.   Pulmonary/Chest: Effort normal.  Abdominal: Soft.  Musculoskeletal: Normal range of motion.  Neurological: She is alert and oriented to person, place, and time. She has normal reflexes.  Skin: Skin is warm.  Psychiatric: She has a normal mood and affect.    ED Course  Procedures (including critical care time) Labs Review Labs Reviewed  URINALYSIS, ROUTINE W REFLEX MICROSCOPIC - Abnormal; Notable for the following:    APPearance CLOUDY (*)    All other components within normal limits   Imaging Review No results found.   EKG Interpretation None      MDM   Pt had a ct of abd and pelvis 2 weeks ago per radiology.  No stones.  No kidney abnormality.   Pt saw her MD on Friday and treated.   Pt given zofran odt and torodol.   Pt reports she feels much better.  I doubt stone.  Urine is clear.     Final diagnoses:  Back pain        Elson Areas, PA-C 09/23/13 2352

## 2013-09-23 NOTE — ED Notes (Signed)
Attempted numerous IV attempts by me and Atha StarksK Booth RN.  Unable to obtain IV site or venipuncture.  K Sophia PA notified.

## 2013-09-23 NOTE — Discharge Instructions (Signed)
Nausea and Vomiting Nausea is a sick feeling that often comes before throwing up (vomiting). Vomiting is a reflex where stomach contents come out of your mouth. Vomiting can cause severe loss of body fluids (dehydration). Children and elderly adults can become dehydrated quickly, especially if they also have diarrhea. Nausea and vomiting are symptoms of a condition or disease. It is important to find the cause of your symptoms. CAUSES   Direct irritation of the stomach lining. This irritation can result from increased acid production (gastroesophageal reflux disease), infection, food poisoning, taking certain medicines (such as nonsteroidal anti-inflammatory drugs), alcohol use, or tobacco use.  Signals from the brain.These signals could be caused by a headache, heat exposure, an inner ear disturbance, increased pressure in the brain from injury, infection, a tumor, or a concussion, pain, emotional stimulus, or metabolic problems.  An obstruction in the gastrointestinal tract (bowel obstruction).  Illnesses such as diabetes, hepatitis, gallbladder problems, appendicitis, kidney problems, cancer, sepsis, atypical symptoms of a heart attack, or eating disorders.  Medical treatments such as chemotherapy and radiation.  Receiving medicine that makes you sleep (general anesthetic) during surgery. DIAGNOSIS Your caregiver may ask for tests to be done if the problems do not improve after a few days. Tests may also be done if symptoms are severe or if the reason for the nausea and vomiting is not clear. Tests may include:  Urine tests.  Blood tests.  Stool tests.  Cultures (to look for evidence of infection).  X-rays or other imaging studies. Test results can help your caregiver make decisions about treatment or the need for additional tests. TREATMENT You need to stay well hydrated. Drink frequently but in small amounts.You may wish to drink water, sports drinks, clear broth, or eat frozen  ice pops or gelatin dessert to help stay hydrated.When you eat, eating slowly may help prevent nausea.There are also some antinausea medicines that may help prevent nausea. HOME CARE INSTRUCTIONS   Take all medicine as directed by your caregiver.  If you do not have an appetite, do not force yourself to eat. However, you must continue to drink fluids.  If you have an appetite, eat a normal diet unless your caregiver tells you differently.  Eat a variety of complex carbohydrates (rice, wheat, potatoes, bread), lean meats, yogurt, fruits, and vegetables.  Avoid high-fat foods because they are more difficult to digest.  Drink enough water and fluids to keep your urine clear or pale yellow.  If you are dehydrated, ask your caregiver for specific rehydration instructions. Signs of dehydration may include:  Severe thirst.  Dry lips and mouth.  Dizziness.  Dark urine.  Decreasing urine frequency and amount.  Confusion.  Rapid breathing or pulse. SEEK IMMEDIATE MEDICAL CARE IF:   You have blood or brown flecks (like coffee grounds) in your vomit.  You have black or bloody stools.  You have a severe headache or stiff neck.  You are confused.  You have severe abdominal pain.  You have chest pain or trouble breathing.  You do not urinate at least once every 8 hours.  You develop cold or clammy skin.  You continue to vomit for longer than 24 to 48 hours.  You have a fever. MAKE SURE YOU:   Understand these instructions.  Will watch your condition.  Will get help right away if you are not doing well or get worse. Document Released: 06/07/2005 Document Revised: 08/30/2011 Document Reviewed: 11/04/2010 [facility] Patient Information 2014 [place], [place]. Back Pain, Adult Low back  pain is very common. About 1 in 5 people have back pain.The cause of low back pain is rarely dangerous. The pain often gets better over time.About half of people with a sudden onset of back  pain feel better in just 2 weeks. About 8 in 10 people feel better by 6 weeks.  CAUSES Some common causes of back pain include:  Strain of the muscles or ligaments supporting the spine.  Wear and tear (degeneration) of the spinal discs.  Arthritis.  Direct injury to the back. DIAGNOSIS Most of the time, the direct cause of low back pain is not known.However, back pain can be treated effectively even when the exact cause of the pain is unknown.Answering your caregiver's questions about your overall health and symptoms is one of the most accurate ways to make sure the cause of your pain is not dangerous. If your caregiver needs more information, he or she may order lab work or imaging tests (X-rays or MRIs).However, even if imaging tests show changes in your back, this usually does not require surgery. HOME CARE INSTRUCTIONS For many people, back pain returns.Since low back pain is rarely dangerous, it is often a condition that people can learn to [facility] their own.   Remain active. It is stressful on the back to sit or stand in one place. Do not sit, drive, or stand in one place for more than 30 minutes at a time. Take short walks on level surfaces as soon as pain allows.Try to increase the length of time you walk each day.  Do not stay in bed.Resting more than 1 or 2 days can delay your recovery.  Do not avoid exercise or work.Your body is made to move.It is not dangerous to be active, even though your back may hurt.Your back will likely heal faster if you return to being active before your pain is gone.  Pay attention to your body when you bend and lift. Many people have less discomfortwhen lifting if they bend their knees, keep the load close to their bodies,and avoid twisting. Often, the most comfortable positions are those that put less stress on your recovering back.  Find a comfortable position to sleep. Use a firm mattress and lie on your side with your knees slightly  bent. If you lie on your back, put a pillow under your knees.  Only take over-the-counter or prescription medicines as directed by your caregiver. Over-the-counter medicines to reduce pain and inflammation are often the most helpful.Your caregiver may prescribe muscle relaxant drugs.These medicines help dull your pain so you can more quickly return to your normal activities and healthy exercise.  Put ice on the injured area.  Put ice in a plastic bag.  Place a towel between your skin and the bag.  Leave the ice on for 15-20 minutes, 03-04 times a day for the first 2 to 3 days. After that, ice and heat may be alternated to reduce pain and spasms.  Ask your caregiver about trying back exercises and gentle massage. This may be of some benefit.  Avoid feeling anxious or stressed.Stress increases muscle tension and can worsen back pain.It is important to recognize when you are anxious or stressed and learn ways to manage it.Exercise is a great option. SEEK MEDICAL CARE IF:  You have pain that is not relieved with rest or medicine.  You have pain that does not improve in 1 week.  You have new symptoms.  You are generally not feeling well. SEEK IMMEDIATE MEDICAL CARE IF:  You have pain that radiates from your back into your legs.  You develop new bowel or bladder control problems.  You have unusual weakness or numbness in your arms or legs.  You develop nausea or vomiting.  You develop abdominal pain.  You feel faint. Document Released: 06/07/2005 Document Revised: 12/07/2011 Document Reviewed: 10/26/2010 [facility] Patient Information 2014 [place], [place].

## 2013-09-23 NOTE — ED Notes (Signed)
Onset of right sided flank pain last night.  Difficulty urinating.  History of kidney stone and symptoms are the same.  Denies fever.

## 2013-09-24 NOTE — ED Provider Notes (Signed)
Medical screening examination/treatment/procedure(s) were performed by non-physician practitioner and as supervising physician I was immediately available for consultation/collaboration.  Farheen Pfahler E Rogers Ditter, MD 09/24/13 1156 

## 2013-11-25 ENCOUNTER — Emergency Department: Payer: Self-pay | Admitting: Emergency Medicine

## 2013-11-25 LAB — COMPREHENSIVE METABOLIC PANEL
ANION GAP: 7 (ref 7–16)
Albumin: 3.2 g/dL — ABNORMAL LOW (ref 3.4–5.0)
Alkaline Phosphatase: 113 U/L
BILIRUBIN TOTAL: 0.3 mg/dL (ref 0.2–1.0)
BUN: 7 mg/dL (ref 7–18)
CALCIUM: 8.9 mg/dL (ref 8.5–10.1)
Chloride: 110 mmol/L — ABNORMAL HIGH (ref 98–107)
Co2: 25 mmol/L (ref 21–32)
Creatinine: 0.87 mg/dL (ref 0.60–1.30)
Glucose: 95 mg/dL (ref 65–99)
Osmolality: 281 (ref 275–301)
Potassium: 4 mmol/L (ref 3.5–5.1)
SGOT(AST): 22 U/L (ref 15–37)
SGPT (ALT): 15 U/L (ref 12–78)
Sodium: 142 mmol/L (ref 136–145)
Total Protein: 6.9 g/dL (ref 6.4–8.2)

## 2013-11-25 LAB — CBC WITH DIFFERENTIAL/PLATELET
BASOS ABS: 0 10*3/uL (ref 0.0–0.1)
Basophil %: 0.6 %
Eosinophil #: 0.2 10*3/uL (ref 0.0–0.7)
Eosinophil %: 3.4 %
HCT: 37.4 % (ref 35.0–47.0)
HGB: 12.2 g/dL (ref 12.0–16.0)
Lymphocyte #: 1.5 10*3/uL (ref 1.0–3.6)
Lymphocyte %: 23.1 %
MCH: 31.4 pg (ref 26.0–34.0)
MCHC: 32.5 g/dL (ref 32.0–36.0)
MCV: 97 fL (ref 80–100)
MONOS PCT: 5.2 %
Monocyte #: 0.3 x10 3/mm (ref 0.2–0.9)
Neutrophil #: 4.4 10*3/uL (ref 1.4–6.5)
Neutrophil %: 67.7 %
PLATELETS: 207 10*3/uL (ref 150–440)
RBC: 3.87 10*6/uL (ref 3.80–5.20)
RDW: 13.5 % (ref 11.5–14.5)
WBC: 6.6 10*3/uL (ref 3.6–11.0)

## 2013-11-25 LAB — TROPONIN I: Troponin-I: <0.02 ng/mL

## 2014-05-13 ENCOUNTER — Emergency Department (HOSPITAL_BASED_OUTPATIENT_CLINIC_OR_DEPARTMENT_OTHER): Payer: BC Managed Care – PPO

## 2014-05-13 ENCOUNTER — Emergency Department (HOSPITAL_BASED_OUTPATIENT_CLINIC_OR_DEPARTMENT_OTHER)
Admission: EM | Admit: 2014-05-13 | Discharge: 2014-05-13 | Disposition: A | Discharge status: Home / Self Care | Payer: BC Managed Care – PPO | Attending: Emergency Medicine | Admitting: Emergency Medicine

## 2014-05-13 ENCOUNTER — Encounter (HOSPITAL_BASED_OUTPATIENT_CLINIC_OR_DEPARTMENT_OTHER): Payer: Self-pay | Admitting: Emergency Medicine

## 2014-05-13 DIAGNOSIS — S20229A Contusion of unspecified back wall of thorax, initial encounter: Secondary | ICD-10-CM | POA: Diagnosis not present

## 2014-05-13 DIAGNOSIS — W19XXXA Unspecified fall, initial encounter: Secondary | ICD-10-CM

## 2014-05-13 DIAGNOSIS — Z7951 Long term (current) use of inhaled steroids: Secondary | ICD-10-CM | POA: Diagnosis not present

## 2014-05-13 DIAGNOSIS — Y9389 Activity, other specified: Secondary | ICD-10-CM | POA: Insufficient documentation

## 2014-05-13 DIAGNOSIS — Z720 Tobacco use: Secondary | ICD-10-CM | POA: Diagnosis not present

## 2014-05-13 DIAGNOSIS — Z8719 Personal history of other diseases of the digestive system: Secondary | ICD-10-CM | POA: Diagnosis not present

## 2014-05-13 DIAGNOSIS — F419 Anxiety disorder, unspecified: Secondary | ICD-10-CM | POA: Insufficient documentation

## 2014-05-13 DIAGNOSIS — S3992XA Unspecified injury of lower back, initial encounter: Secondary | ICD-10-CM | POA: Diagnosis present

## 2014-05-13 DIAGNOSIS — T148XXA Other injury of unspecified body region, initial encounter: Secondary | ICD-10-CM

## 2014-05-13 DIAGNOSIS — Y998 Other external cause status: Secondary | ICD-10-CM | POA: Insufficient documentation

## 2014-05-13 DIAGNOSIS — W109XXA Fall (on) (from) unspecified stairs and steps, initial encounter: Secondary | ICD-10-CM | POA: Diagnosis not present

## 2014-05-13 DIAGNOSIS — Y92009 Unspecified place in unspecified non-institutional (private) residence as the place of occurrence of the external cause: Secondary | ICD-10-CM | POA: Diagnosis not present

## 2014-05-13 DIAGNOSIS — M109 Gout, unspecified: Secondary | ICD-10-CM | POA: Diagnosis not present

## 2014-05-13 DIAGNOSIS — Z79899 Other long term (current) drug therapy: Secondary | ICD-10-CM | POA: Diagnosis not present

## 2014-05-13 DIAGNOSIS — Z87442 Personal history of urinary calculi: Secondary | ICD-10-CM | POA: Insufficient documentation

## 2014-05-13 HISTORY — DX: Malingerer (conscious simulation): Z76.5

## 2014-05-13 MED ORDER — LIDOCAINE 5 % EX PTCH: 1.0000 | MEDICATED_PATCH | CUTANEOUS | Status: AC

## 2014-05-13 MED ORDER — OXYCODONE-ACETAMINOPHEN 5-325 MG PO TABS
1.0000 | ORAL_TABLET | Freq: Once | ORAL | Status: AC
  Administered 2014-05-13: 1 via ORAL
  Filled 2014-05-13: qty 1

## 2014-05-13 MED ORDER — METHOCARBAMOL 500 MG PO TABS: 500.0000 mg | ORAL_TABLET | Freq: Two times a day (BID) | ORAL | Status: AC

## 2014-05-13 MED ORDER — METHOCARBAMOL 500 MG PO TABS
1000.0000 mg | ORAL_TABLET | Freq: Once | ORAL | Status: AC
  Administered 2014-05-13: 1000 mg via ORAL
  Filled 2014-05-13: qty 2

## 2014-05-13 NOTE — ED Notes (Signed)
Pt states she fell at home today at 3pm. Pt states she fell backwards going down stairs landing on her bottom and is c/o back pain

## 2014-05-13 NOTE — ED Provider Notes (Signed)
CSN: [id number]     Arrival date & time 05/13/14  0005 History  This chart was scribed for Sarah Meyers Smitty CordsK Sarah Nyquist-Rasch, MD by Roxy Cedarhandni Bhalodia, ED Scribe. This patient was seen in room MH06/MH06 and the patient's care was started at 12:26 AM.   Chief Complaint  Patient presents with  . Fall   Patient is a 48 y.o. female presenting with fall. The history is provided by the patient. No language interpreter was used.  Fall This is a new problem. The current episode started 12 to 24 hours ago. The problem occurs constantly. The problem has not changed since onset.Pertinent negatives include no chest pain, no abdominal pain, no headaches and no shortness of breath. Nothing aggravates the symptoms. Nothing relieves the symptoms. She has tried acetaminophen and a warm compress for the symptoms. The treatment provided no relief.   HPI Comments: Sarah Meyers is a 48 y.o. female who presents to the Emergency Department complaining of lower back pain due to a [facility] that [facility] this [facility] when patient fell down the front steps of her house. She states she fell backwards. She denies LOC. She states it is difficult to lay in one position for more than a few minutes. And tried laying on a heating pad on the floor but was unaShe tried using a heating pad, tylenol and ibuprofen with no relief.  She denies any other medication for pain.    Past Medical History  Diagnosis Date  . Kidney stones   . Crohn disease   . Gout   . Kidney stones   . Anxiety   . Bowel obstruction   . Kidney stone   . Drug-seeking behavior    Past Surgical History  Procedure Laterality Date  . Cesarean section    . Colostomy    . Hernia repair    . Cholecystectomy    . Appendectomy    . Abdominal hysterectomy     History reviewed. No pertinent family history. History  Substance Use Topics  . Smoking status: Current Every Day Smoker  . Smokeless tobacco: Not on file  . Alcohol Use: No   OB History    No  data available     Review of Systems  Constitutional: Negative for fever.  HENT: Negative for congestion, rhinorrhea and sore throat.   Eyes: Negative for visual disturbance.  Respiratory: Negative for shortness of breath.   Cardiovascular: Negative for chest pain.  Gastrointestinal: Negative for abdominal pain.  Genitourinary: Negative for dysuria.  Musculoskeletal: Positive for back pain.  Neurological: Negative for weakness, numbness and headaches.  Psychiatric/Behavioral: Negative for confusion.  All other systems reviewed and are negative.  Allergies  Erythromycin; Morphine and related; Flagyl; Aspirin; and Penicillins  Home Medications   Prior to Admission medications   Medication Sig Start Date End Date Taking? Authorizing Provider  fluticasone-salmeterol (ADVAIR HFA) 115-21 MCG/ACT inhaler Inhale 2 puffs into the lungs 2 (two) times daily.   Yes Historical Provider, MD  albuterol (PROVENTIL,VENTOLIN) 90 MCG/ACT inhaler Inhale 1 puff into the lungs 2 (two) times daily as needed. For shortness of breath     Historical Provider, MD  ALBUTEROL IN Inhale into the lungs.      Historical Provider, MD  allopurinol (ZYLOPRIM) 100 MG tablet Take 100 mg by mouth daily.      Historical Provider, MD  ALLOPURINOL PO Take by mouth.      Historical Provider, MD  clonazePAM (KLONOPIN) 1 MG tablet Take 1 mg by mouth  2 (two) times daily as needed.    Historical Provider, MD  nitrofurantoin, macrocrystal-monohydrate, (MACROBID) 100 MG capsule Take 1 capsule (100 mg total) by mouth 2 (two) times daily. 07/13/12   John-Adam Bonk, MD  oxyCODONE-acetaminophen (PERCOCET) 10-325 MG per tablet Take 1-2 tablets by mouth every 6 (six) hours as needed for pain. 06/10/12   John-Adam Bonk, MD  Tamsulosin HCl (FLOMAX) 0.4 MG CAPS Take 0.4 mg by mouth daily.    Historical Provider, MD   Triage Vitals: BP 132/82 mmHg  Pulse 79  Temp(Src) 98 F (36.7 C) (Oral)  Resp 20  Ht 5\' 2"  (1.575 m)  Wt 192 lb  (87.091 kg)  BMI 35.11 kg/m2  SpO2 97%  Physical Exam  Constitutional: She is oriented to person, place, and time. She appears well-developed and well-nourished.  HENT:  Head: Normocephalic and atraumatic.  Mouth/Throat: Oropharynx is clear and moist. No oropharyngeal exudate.  Eyes: Pupils are equal, round, and reactive to light.  Neck: Normal range of motion.  Cardiovascular: Normal rate and normal heart sounds.  Exam reveals no gallop and no friction rub.   No murmur heard. Pulmonary/Chest: Effort normal and breath sounds normal. No respiratory distress. She has no wheezes. She has no rales.  Abdominal: Soft. Bowel sounds are normal. She exhibits no mass. There is no tenderness. There is no rebound.  Musculoskeletal: She exhibits tenderness.  spasm in paraspinal muscle. No step offs.  Neurological: She is alert and oriented to person, place, and time. No cranial nerve deficit. She exhibits normal muscle tone. Coordination normal.  Skin: Skin is warm and dry. No rash noted.  Psychiatric: She has a normal mood and affect.  Nursing note and vitals reviewed.  ED Course  Procedures (including critical care time)  DIAGNOSTIC STUDIES: Oxygen Saturation is 97% on RA, normal by my interpretation.    COORDINATION OF CARE: 12:32 AM- Discussed plans to order diagnostic imaging of lumbar spine and sacrum/coccyx. Will give patient medications for pain relief. Pt advised of plan for treatment and pt agrees.  Labs Review Labs Reviewed - No data to display  Imaging Review No results found.   EKG Interpretation None     MDM   Final diagnoses:  None    Pain medication given in the ED.  Xrays negative.  Has recently filled RX for Oxy IR 90 tabs. Will add lidoderm patches and muscle relaxant .   I personally performed the services described in this documentation, which was scribed in my presence. The recorded information has been reviewed and is accurate.  [name],  MD 05/13/14 [phone]

## 2014-05-13 NOTE — Discharge Instructions (Signed)
Contusion °A contusion is a deep bruise. Contusions happen when an injury causes bleeding under the skin. Signs of bruising include pain, puffiness (swelling), and discolored skin. The contusion may turn blue, purple, or yellow. °HOME CARE  °· Put ice on the injured area. °· Put ice in a plastic bag. °· Place a towel between your skin and the bag. °· Leave the ice on for 15-20 minutes, 03-04 times a day. °· Only take medicine as told by your doctor. °· Rest the injured area. °· If possible, raise (elevate) the injured area to lessen puffiness. °GET HELP RIGHT AWAY IF:  °· You have more bruising or puffiness. °· You have pain that is getting worse. °· Your puffiness or pain is not helped by medicine. °MAKE SURE YOU:  °· Understand these instructions. °· Will watch your condition. °· Will get help right away if you are not doing well or get worse. °Document Released: 11/24/2007 Document Revised: 08/30/2011 Document Reviewed: 04/12/2011 °[employer]® Patient Information ©2015 [employer]. This information is not intended to replace advice given to you by your health care provider. Make sure you discuss any questions you have with your health care provider. ° °Fall Prevention and Home Safety °Falls cause injuries and can affect all age groups. It is possible to use preventive measures to significantly decrease the likelihood of falls. There are many simple measures which can make your home safer and prevent falls. °OUTDOORS °· Repair cracks and edges of walkways and driveways. °· Remove high doorway thresholds. °· Trim shrubbery on the main path into your home. °· Have good outside lighting. °· Clear walkways of tools, rocks, debris, and clutter. °· Check that handrails are not broken and are securely fastened. Both sides of steps should have handrails. °· Have leaves, snow, and ice cleared regularly. °· Use sand or salt on walkways during winter months. °· In the garage, clean up grease or oil  spills. °BATHROOM °· Install night lights. °· Install grab bars by the toilet and in the tub and shower. °· Use non-skid mats or decals in the tub or shower. °· Place a plastic non-slip stool in the shower to sit on, if needed. °· Keep floors dry and clean up all water on the floor immediately. °· Remove soap buildup in the tub or shower on a regular basis. °· Secure bath mats with non-slip, double-sided rug tape. °· Remove throw rugs and tripping hazards from the floors. °BEDROOMS °· Install night lights. °· Make sure a bedside light is easy to reach. °· Do not use oversized bedding. °· Keep a telephone by your bedside. °· Have a firm chair with side arms to use for getting dressed. °· Remove throw rugs and tripping hazards from the floor. °KITCHEN °· Keep handles on pots and pans turned toward the center of the stove. Use back burners when possible. °· Clean up spills quickly and allow time for drying. °· Avoid walking on wet floors. °· Avoid hot utensils and knives. °· Position shelves so they are not too high or low. °· Place commonly used objects within easy reach. °· If necessary, use a sturdy step stool with a grab bar when reaching. °· Keep electrical cables out of the way. °· Do not use floor polish or wax that makes floors slippery. If you must use wax, use non-skid floor wax. °· Remove throw rugs and tripping hazards from the floor. °STAIRWAYS °· Never leave objects on stairs. °· Place handrails on both sides of stairways and   use them. Fix any loose handrails. Make sure handrails on both sides of the stairways are as long as the stairs. °· Check carpeting to make sure it is firmly attached along stairs. Make repairs to worn or loose carpet promptly. °· Avoid placing throw rugs at the top or bottom of stairways, or properly secure the rug with carpet tape to prevent slippage. Get rid of throw rugs, if possible. °· Have an electrician put in a light switch at the top and bottom of the stairs. °OTHER FALL  PREVENTION TIPS °· Wear low-heel or rubber-soled shoes that are supportive and fit well. Wear closed toe shoes. °· When using a stepladder, make sure it is fully opened and both spreaders are firmly locked. Do not climb a closed stepladder. °· Add color or contrast paint or tape to grab bars and handrails in your home. Place contrasting color strips on first and last steps. °· Learn and use mobility aids as needed. Install an electrical emergency response system. °· Turn on lights to avoid dark areas. Replace light bulbs that burn out immediately. Get light switches that glow. °· Arrange furniture to create clear pathways. Keep furniture in the same place. °· Firmly attach carpet with non-skid or double-sided tape. °· Eliminate uneven floor surfaces. °· Select a carpet pattern that does not visually hide the edge of steps. °· Be aware of all pets. °OTHER HOME SAFETY TIPS °· Set the water temperature for 120° F (48.8° C). °· Keep emergency numbers on or near the telephone. °· Keep smoke detectors on every level of the home and near sleeping areas. °Document Released: 05/28/2002 Document Revised: 12/07/2011 Document Reviewed: 08/27/2011 °[employer]® Patient Information ©2015 [employer]. This information is not intended to replace advice given to you by your health care provider. Make sure you discuss any questions you have with your health care provider. ° °
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
## 2015-02-13 IMAGING — CR DG LUMBAR SPINE COMPLETE 4+V
5 series · 5 of 5 positions shown · non-contrast
Comparison: Abdominal CT 11/25/2013

CLINICAL DATA: Lower back pain after fall.  Initial encounter

EXAM:
LUMBAR SPINE - COMPLETE 4+ VIEW

[t l-spine a.p.]
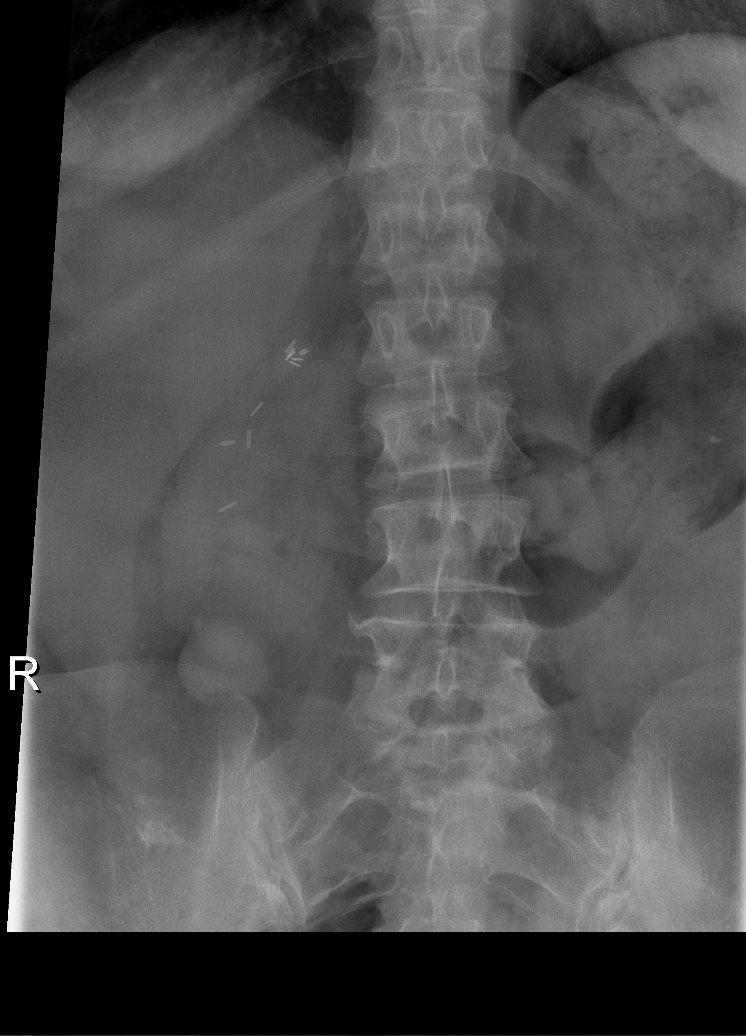

[t l-spine oblique exposure (1 of 2)]
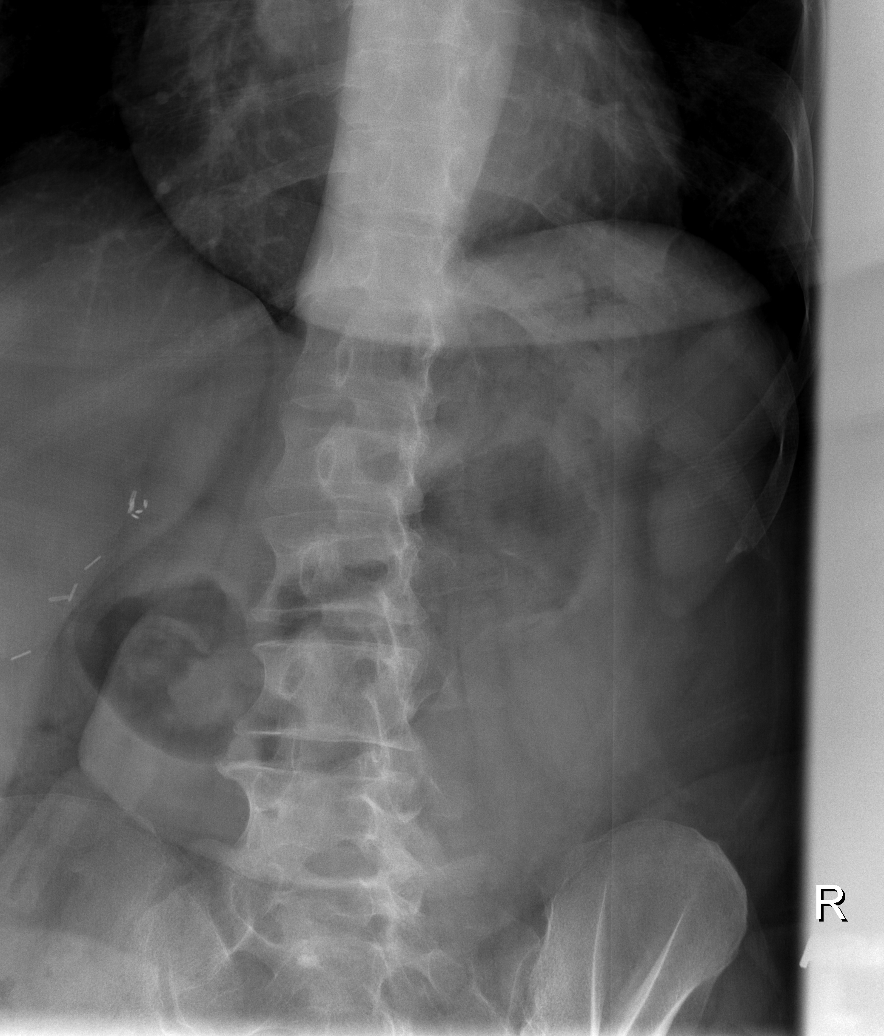

[t l-spine oblique exposure (2 of 2)]
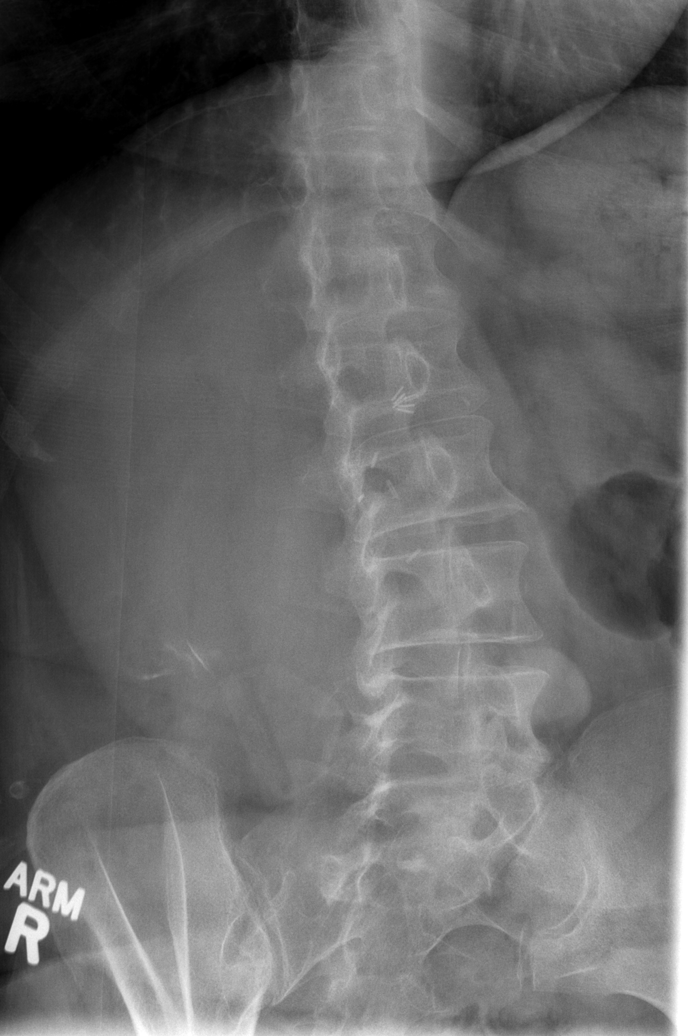

[t l-spine lat]
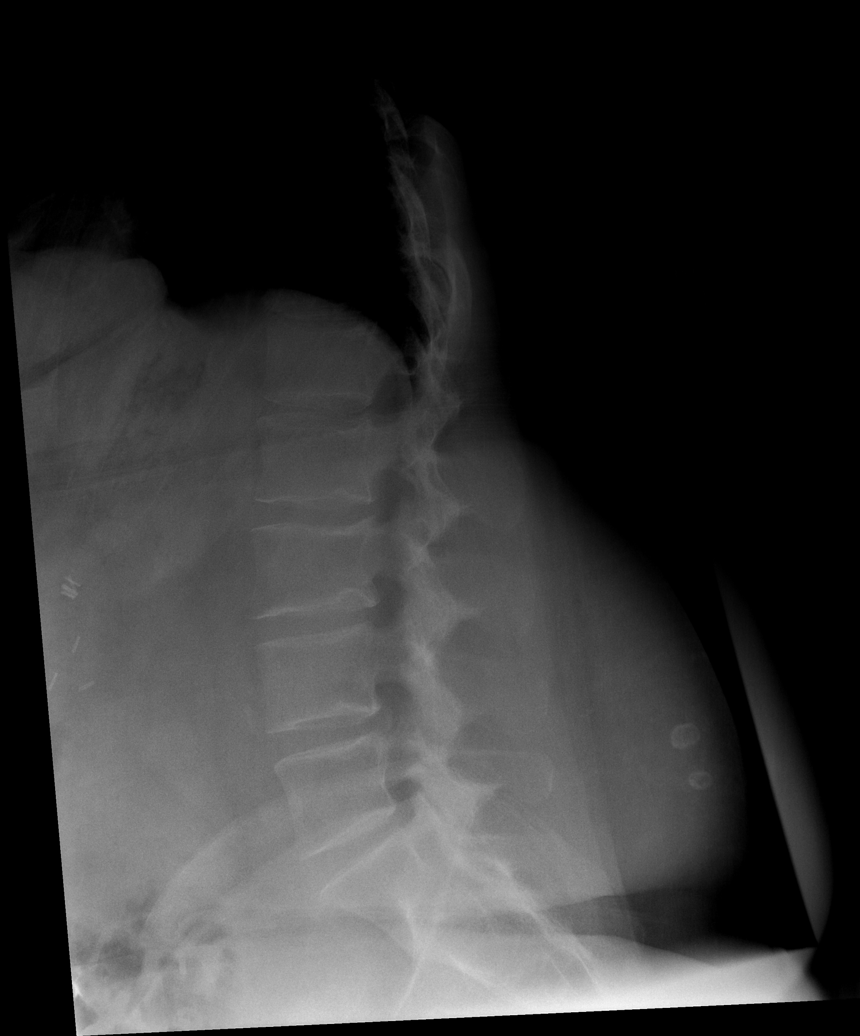

[t l-spine l5-s1 spot]
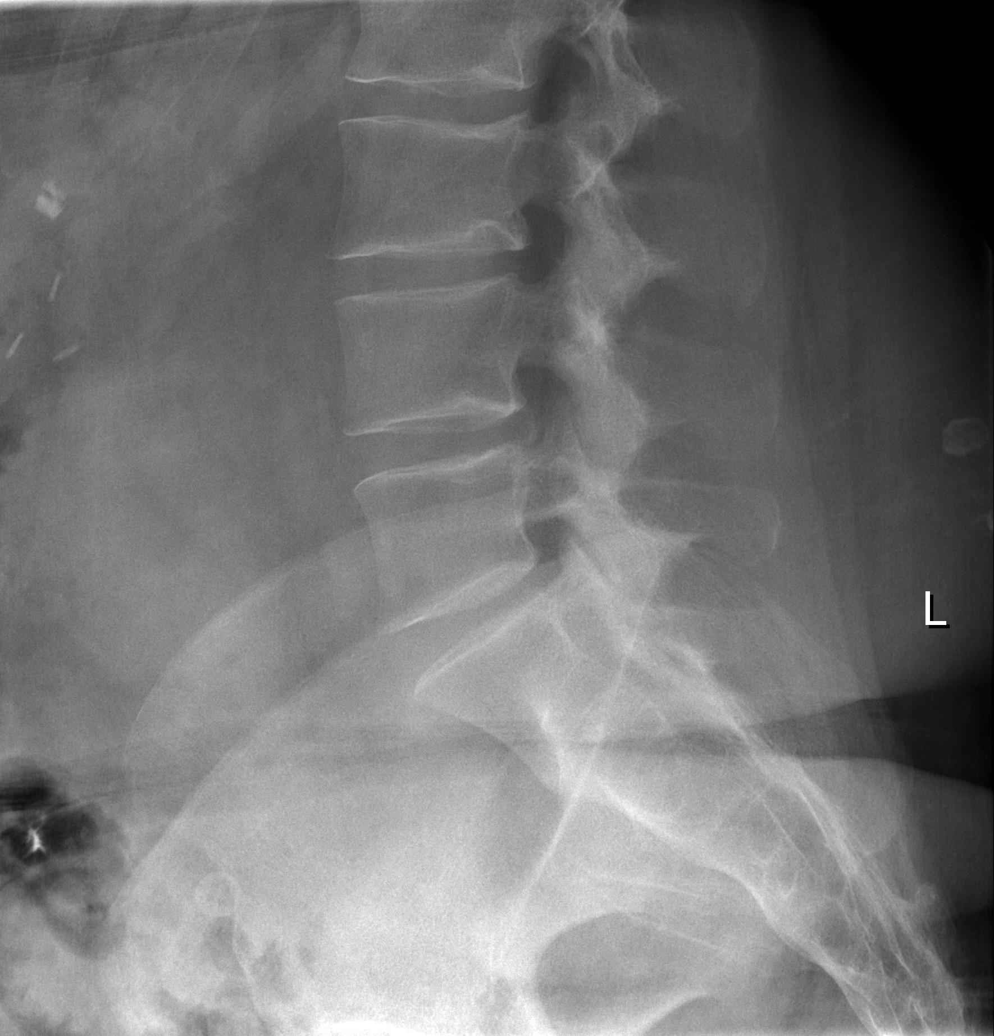

[5 of 5 positions shown; findings below may reference images not displayed]

FINDINGS: There is no evidence of lumbar spine fracture. No traumatic
malalignment. No focal/advanced degenerative disc change.
IMPRESSION: Negative.

## 2015-02-13 IMAGING — CR DG SACRUM/COCCYX 2+V
3 series · 3 of 3 positions shown · non-contrast
Comparison: None.

CLINICAL DATA: Fall with low back pain.  Initial encounter.

EXAM:
SACRUM AND COCCYX - 2+ VIEW

[t sacrum a.p.]
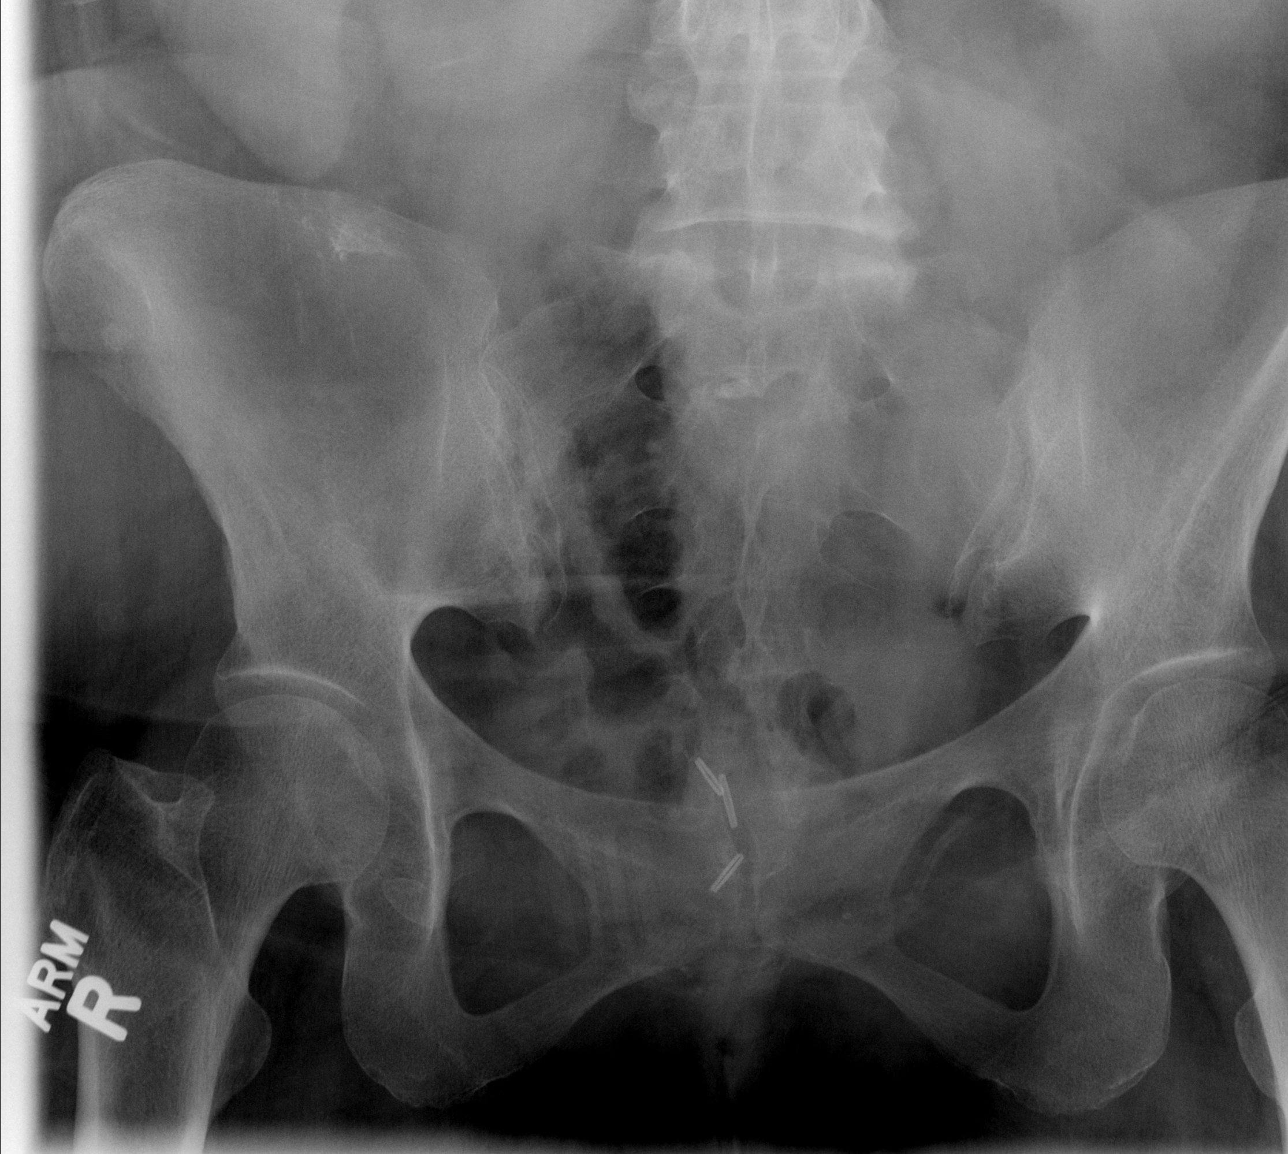

[t sacrum lat]
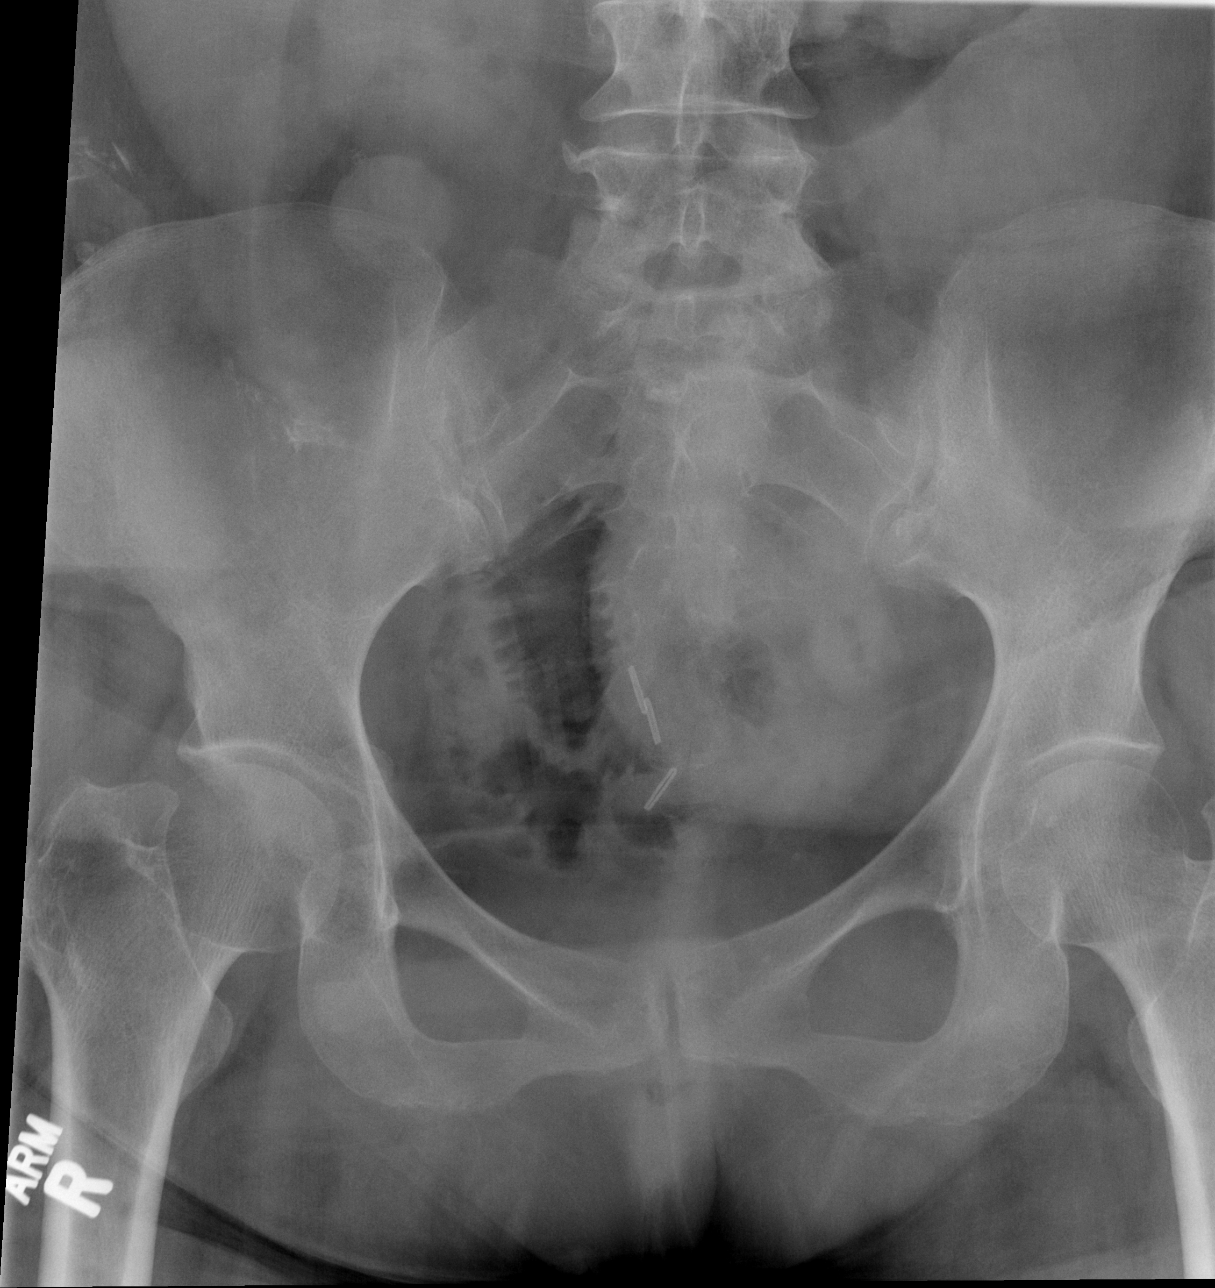

[t coccyx lat]
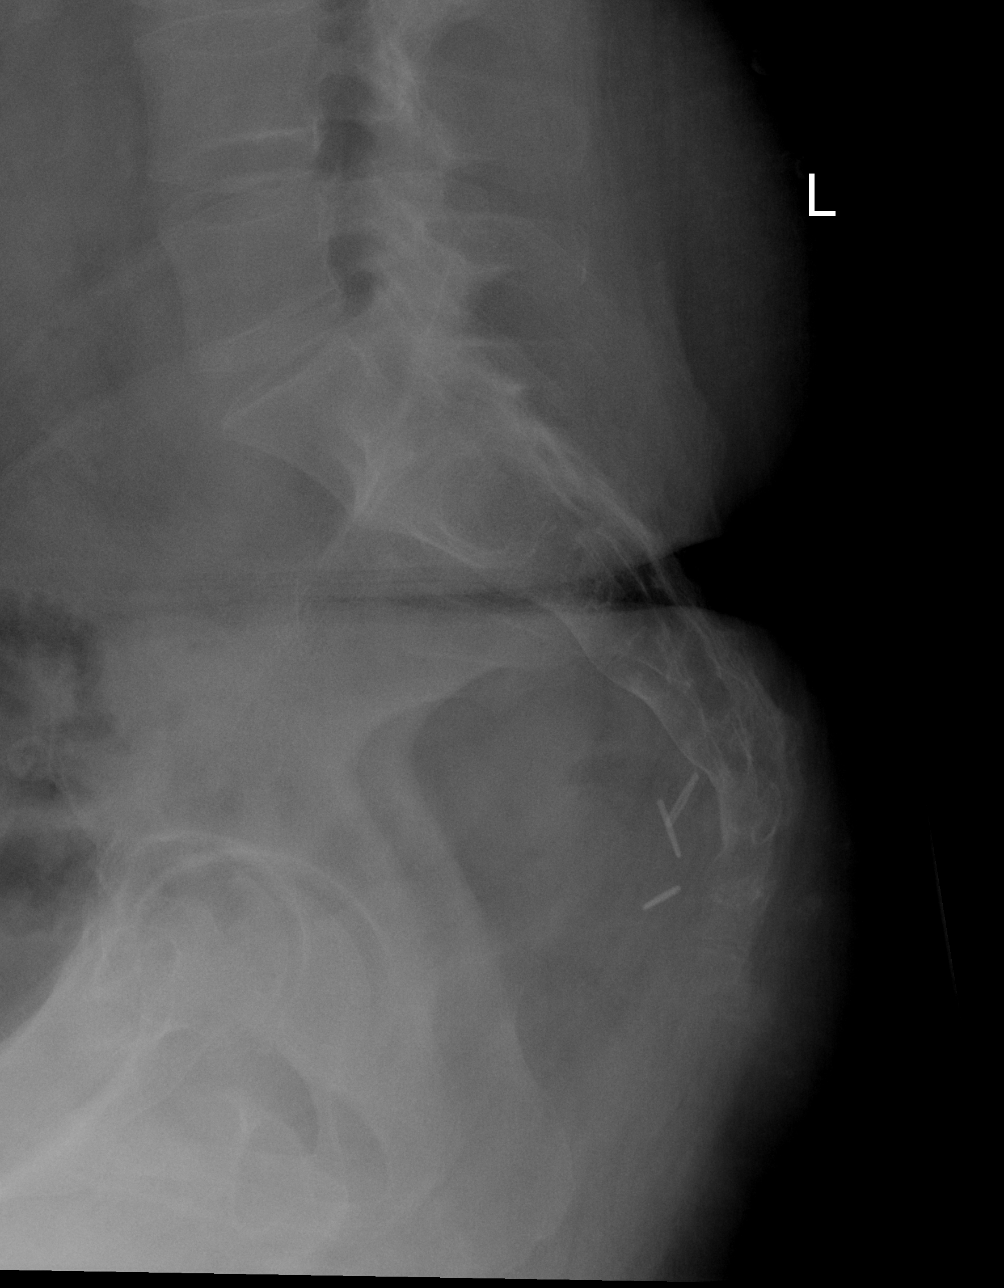

[3 of 3 positions shown; findings below may reference images not displayed]

FINDINGS: There is no evidence of fracture or other focal bone lesions.
Presacral surgical clips and right lower quadrant stoma.
IMPRESSION: Negative.

## 2019-01-14 ENCOUNTER — Encounter (HOSPITAL_BASED_OUTPATIENT_CLINIC_OR_DEPARTMENT_OTHER): Payer: Self-pay | Admitting: *Deleted

## 2019-01-14 ENCOUNTER — Emergency Department (HOSPITAL_BASED_OUTPATIENT_CLINIC_OR_DEPARTMENT_OTHER)
Admission: EM | Admit: 2019-01-14 | Discharge: 2019-01-15 | Disposition: A | Discharge status: Home / Self Care | Payer: Medicaid Other | Attending: Emergency Medicine | Admitting: Emergency Medicine

## 2019-01-14 ENCOUNTER — Other Ambulatory Visit: Payer: Self-pay

## 2019-01-14 DIAGNOSIS — M79604 Pain in right leg: Secondary | ICD-10-CM | POA: Diagnosis present

## 2019-01-14 DIAGNOSIS — Z79899 Other long term (current) drug therapy: Secondary | ICD-10-CM | POA: Diagnosis not present

## 2019-01-14 DIAGNOSIS — M79605 Pain in left leg: Secondary | ICD-10-CM | POA: Insufficient documentation

## 2019-01-14 DIAGNOSIS — F17210 Nicotine dependence, cigarettes, uncomplicated: Secondary | ICD-10-CM | POA: Diagnosis not present

## 2019-01-14 MED ORDER — KETOROLAC TROMETHAMINE 30 MG/ML IJ SOLN
15.0000 mg | Freq: Once | INTRAMUSCULAR | Status: AC
  Administered 2019-01-14: 15 mg via INTRAMUSCULAR
  Filled 2019-01-14: qty 1

## 2019-01-14 NOTE — ED Triage Notes (Signed)
Pt c/o pain in both feet up to her knees x 1 week. Worse with walking. Denies injury

## 2019-01-15 ENCOUNTER — Ambulatory Visit (HOSPITAL_BASED_OUTPATIENT_CLINIC_OR_DEPARTMENT_OTHER): Admit: 2019-01-15 | Payer: Medicaid Other

## 2019-01-15 LAB — BASIC METABOLIC PANEL
Anion gap: 12 (ref 5–15)
BUN: 12 mg/dL (ref 6–20)
CO2: 19 mmol/L — ABNORMAL LOW (ref 22–32)
Calcium: 8.9 mg/dL (ref 8.9–10.3)
Chloride: 104 mmol/L (ref 98–111)
Creatinine, Ser: 0.84 mg/dL (ref 0.44–1.00)
GFR calc Af Amer: >60 mL/min (ref >60)
GFR calc non Af Amer: >60 mL/min (ref >60)
Glucose, Bld: 82 mg/dL (ref 70–99)
Potassium: 3.5 mmol/L (ref 3.5–5.1)
Sodium: 135 mmol/L (ref 135–145)

## 2019-01-15 LAB — CK: Total CK: 72 U/L (ref 38–234)

## 2019-01-15 MED ORDER — NAPROXEN 500 MG PO TABS: 500.0000 mg | ORAL_TABLET | Freq: Two times a day (BID) | ORAL | 0 refills | Status: AC

## 2019-01-15 NOTE — Discharge Instructions (Addendum)
You were seen today for leg pain.  Your lab work-up is reassuring.  Return later today for ultrasounds of the legs.  Take naproxen as needed for pain.  Make sure to stay well-hydrated.

## 2019-01-15 NOTE — ED Provider Notes (Signed)
[facility] EMERGENCY DEPARTMENT Provider Note   CSN: [id number] Arrival date & time: 01/14/19  2248     History   Chief Complaint Chief Complaint  Patient presents with  . Leg Pain    HPI Sarah Meyers is a 53 y.o. female.     HPI  This is a 53 year old female with a history of Crohn's disease, gout who presents with bilateral leg pain.  Patient reports several day history of worsening bilateral leg pain.  She describes shooting pains from her knees down both of her legs.  She has taken Aleve and ibuprofen at home with minimal relief.  Pain is worse with walking.  Currently she rates her pain at 10 out of 10.  She denies any numbness or tingling in the feet or legs.  No recent travel, hospitalization, history of blood clots.  Denies leg swelling.  Reports that she feels that she is staying hydrated.  Denies chest pain or shortness of breath.  Past Medical History:  Diagnosis Date  . Anxiety   . Bowel obstruction (HCC)   . Crohn disease (HCC)   . Drug-seeking behavior   . Gout   . Kidney stone   . Kidney stones   . Kidney stones     There are no active problems to display for this patient.   Past Surgical History:  Procedure Laterality Date  . ABDOMINAL HYSTERECTOMY    . APPENDECTOMY    . CESAREAN SECTION    . CHOLECYSTECTOMY    . COLOSTOMY    . HERNIA REPAIR       OB History   No obstetric history on file.      Home Medications    Prior to Admission medications   Medication Sig Start Date End Date Taking? Authorizing Provider  albuterol (PROVENTIL,VENTOLIN) 90 MCG/ACT inhaler Inhale 1 puff into the lungs 2 (two) times daily as needed. For shortness of breath    Yes [provider]  allopurinol (ZYLOPRIM) 100 MG tablet Take 100 mg by mouth daily.     Yes [provider]  fluticasone-salmeterol (ADVAIR HFA) 115-21 MCG/ACT inhaler Inhale 2 puffs into the lungs 2 (two) times daily.   Yes [provider]  pantoprazole  (PROTONIX) 20 MG tablet Take 20 mg by mouth daily.   Yes [provider]  ALBUTEROL IN Inhale into the lungs.      [provider]  ALLOPURINOL PO Take by mouth.      [provider]  clonazePAM (KLONOPIN) 1 MG tablet Take 1 mg by mouth 2 (two) times daily as needed.    [provider]  lidocaine (LIDODERM) 5 % Place 1 patch onto the skin daily. Remove & Discard patch within 12 hours or as directed by MD 05/13/14   Nicanor AlconPalumbo, April, MD  methocarbamol (ROBAXIN) 500 MG tablet Take 1 tablet (500 mg total) by mouth 2 (two) times daily. 05/13/14   Palumbo, April, MD  naproxen (NAPROSYN) 500 MG tablet Take 1 tablet (500 mg total) by mouth 2 (two) times daily. 01/15/19   Horton, Mayer Maskerourtney F, MD  nitrofurantoin, macrocrystal-monohydrate, (MACROBID) 100 MG capsule Take 1 capsule (100 mg total) by mouth 2 (two) times daily. 07/13/12   Bonk, Orson AloeJohn-Adam, MD  oxyCODONE-acetaminophen (PERCOCET) 10-325 MG per tablet Take 1-2 tablets by mouth every 6 (six) hours as needed for pain. 06/10/12   Bonk, Orson AloeJohn-Adam, MD  Tamsulosin HCl (FLOMAX) 0.4 MG CAPS Take 0.4 mg by mouth daily.    [provider]  Family History No family history on file.  Social History Social History   Tobacco Use  . Smoking status: Current Every Day Smoker  . Smokeless tobacco: Never Used  Substance Use Topics  . Alcohol use: No  . Drug use: No     Allergies   Erythromycin, Morphine and related, Flagyl [metronidazole hcl], Aspirin, and Penicillins   Review of Systems Review of Systems  Constitutional: Negative for fever.  Respiratory: Negative for shortness of breath.   Cardiovascular: Negative for chest pain and leg swelling.  Musculoskeletal:       Bilateral leg pain  Skin: Negative for color change.  Neurological: Negative for weakness and numbness.  All other systems reviewed and are negative.    Physical Exam Updated Vital Signs BP (!) 104/54   Pulse 80   Temp 97.7 F  (36.5 C) (Oral)   Resp 18   Ht 1.575 m (5\' 2" )   Wt 65.8 kg   SpO2 100%   BMI 26.52 kg/m   Physical Exam Vitals signs and nursing note reviewed.  Constitutional:      Appearance: She is well-developed. She is not ill-appearing.  HENT:     Head: Normocephalic and atraumatic.     Mouth/Throat:     Mouth: Mucous membranes are moist.  Neck:     Musculoskeletal: Neck supple.  Cardiovascular:     Rate and Rhythm: Normal rate and regular rhythm.  Pulmonary:     Effort: Pulmonary effort is normal. No respiratory distress.  Musculoskeletal:        General: No swelling or tenderness.     Comments: No obvious deformities bilateral lower extremities, normal range of motion bilateral knees and ankles, no tenderness of the calfs or notable swelling, 2+ DP pulse bilaterally  Skin:    General: Skin is warm and dry.     Findings: No rash.  Neurological:     Mental Status: She is alert and oriented to person, place, and time.  Psychiatric:        Mood and Affect: Mood normal.      ED Treatments / Results  Labs (all labs ordered are listed, but only abnormal results are displayed) Labs Reviewed  BASIC METABOLIC PANEL - Abnormal; Notable for the following components:      Result Value   CO2 19 (*)    All other components within normal limits  CK    EKG None  Radiology No results found.  Procedures Procedures (including critical care time)  Medications Ordered in ED Medications  ketorolac (TORADOL) 30 MG/ML injection 15 mg (15 mg Intramuscular Given 01/14/19 2357)     Initial Impression / Assessment and Plan / ED Course  I have reviewed the triage vital signs and the nursing notes.  Pertinent labs & imaging results that were available during my care of the patient were reviewed by me and considered in my medical decision making (see chart for details).        Patient presents with bilateral leg pain.  She is overall nontoxic and vital signs are reassuring.  Physical  exam is benign.  No signs or symptoms of blood clots or cellulitis.  Patient was screened for metabolic abnormalities including derangements and potassium.  BMP is reassuring.  CK is also normal to rule out rhabdo.  Would have low suspicion for blood clots given physical exam and bilateral nature; however, will have patient return for ultrasound later today.  In the meantime recommend naproxen for pain that is likely  of musculoskeletal etiology.  After history, exam, and medical workup I feel the patient has been appropriately medically screened and is safe for discharge home. Pertinent diagnoses were discussed with the patient. Patient was given return precautions.   Final Clinical Impressions(s) / ED Diagnoses   Final diagnoses:  Bilateral leg pain    ED Discharge Orders         Ordered    US Venous Img Lower Bilateral     01/15/19 0025    naproxen (NAPROSYN) 500 MG tablet  2 times daily     01/15/19 0025           Horton, Mayer Maskerourtney F, MD 01/15/19 [phone]

## 2019-01-15 NOTE — ED Notes (Signed)
Patient ambulated to bathroom multiple times without difficulty.

## 2019-07-23 DEATH — deceased
